# Patient Record
Sex: Female | Born: 1975 | Race: Black or African American | Hispanic: No | Marital: Married | Smoking: Never smoker
Health system: Southern US, Community
[De-identification: ages and names within clinical notes are randomized; demographics above are authoritative.]

## PROBLEM LIST (undated history)

## (undated) DIAGNOSIS — E559 Vitamin D deficiency, unspecified: Secondary | ICD-10-CM

## (undated) DIAGNOSIS — N938 Other specified abnormal uterine and vaginal bleeding: Secondary | ICD-10-CM

## (undated) DIAGNOSIS — I10 Essential (primary) hypertension: Secondary | ICD-10-CM

## (undated) DIAGNOSIS — N840 Polyp of corpus uteri: Secondary | ICD-10-CM

## (undated) DIAGNOSIS — IMO0001 Reserved for inherently not codable concepts without codable children: Secondary | ICD-10-CM

## (undated) DIAGNOSIS — M1990 Unspecified osteoarthritis, unspecified site: Secondary | ICD-10-CM

## (undated) DIAGNOSIS — R519 Headache, unspecified: Secondary | ICD-10-CM

## (undated) HISTORY — DX: Polyp of corpus uteri: N84.0

## (undated) HISTORY — DX: Reserved for inherently not codable concepts without codable children: IMO0001

## (undated) HISTORY — DX: Other specified abnormal uterine and vaginal bleeding: N93.8

---

## 2010-10-06 ENCOUNTER — Other Ambulatory Visit: Payer: Self-pay | Admitting: Gynecology

## 2010-10-06 ENCOUNTER — Ambulatory Visit (INDEPENDENT_AMBULATORY_CARE_PROVIDER_SITE_OTHER): Payer: Private Health Insurance - Indemnity | Admitting: Gynecology

## 2010-10-06 DIAGNOSIS — N921 Excessive and frequent menstruation with irregular cycle: Secondary | ICD-10-CM

## 2010-10-06 DIAGNOSIS — R635 Abnormal weight gain: Secondary | ICD-10-CM

## 2010-10-06 DIAGNOSIS — N949 Unspecified condition associated with female genital organs and menstrual cycle: Secondary | ICD-10-CM

## 2010-10-11 ENCOUNTER — Ambulatory Visit (INDEPENDENT_AMBULATORY_CARE_PROVIDER_SITE_OTHER): Payer: Private Health Insurance - Indemnity | Admitting: Gynecology

## 2010-10-11 ENCOUNTER — Other Ambulatory Visit: Payer: Private Health Insurance - Indemnity

## 2010-10-11 DIAGNOSIS — N840 Polyp of corpus uteri: Secondary | ICD-10-CM

## 2010-10-11 DIAGNOSIS — N949 Unspecified condition associated with female genital organs and menstrual cycle: Secondary | ICD-10-CM

## 2010-10-11 DIAGNOSIS — N926 Irregular menstruation, unspecified: Secondary | ICD-10-CM

## 2010-10-11 DIAGNOSIS — N938 Other specified abnormal uterine and vaginal bleeding: Secondary | ICD-10-CM

## 2010-10-11 DIAGNOSIS — N920 Excessive and frequent menstruation with regular cycle: Secondary | ICD-10-CM

## 2010-11-04 ENCOUNTER — Emergency Department (HOSPITAL_COMMUNITY)
Admission: EM | Admit: 2010-11-04 | Discharge: 2010-11-04 | Disposition: A | Discharge status: Home / Self Care | Payer: No Typology Code available for payment source | Attending: Emergency Medicine | Admitting: Emergency Medicine

## 2010-11-04 DIAGNOSIS — M542 Cervicalgia: Secondary | ICD-10-CM | POA: Insufficient documentation

## 2010-11-04 DIAGNOSIS — G43909 Migraine, unspecified, not intractable, without status migrainosus: Secondary | ICD-10-CM | POA: Insufficient documentation

## 2010-11-04 DIAGNOSIS — E669 Obesity, unspecified: Secondary | ICD-10-CM | POA: Insufficient documentation

## 2010-11-07 ENCOUNTER — Other Ambulatory Visit (INDEPENDENT_AMBULATORY_CARE_PROVIDER_SITE_OTHER): Payer: Private Health Insurance - Indemnity

## 2010-11-07 DIAGNOSIS — Z01818 Encounter for other preprocedural examination: Secondary | ICD-10-CM

## 2010-11-07 DIAGNOSIS — R823 Hemoglobinuria: Secondary | ICD-10-CM

## 2010-11-10 ENCOUNTER — Other Ambulatory Visit: Payer: Private Health Insurance - Indemnity

## 2010-11-10 ENCOUNTER — Ambulatory Visit (INDEPENDENT_AMBULATORY_CARE_PROVIDER_SITE_OTHER): Payer: Private Health Insurance - Indemnity | Admitting: Gynecology

## 2010-11-10 DIAGNOSIS — N949 Unspecified condition associated with female genital organs and menstrual cycle: Secondary | ICD-10-CM

## 2010-11-10 DIAGNOSIS — N840 Polyp of corpus uteri: Secondary | ICD-10-CM

## 2010-11-10 DIAGNOSIS — Z01818 Encounter for other preprocedural examination: Secondary | ICD-10-CM

## 2010-11-11 ENCOUNTER — Ambulatory Visit (HOSPITAL_BASED_OUTPATIENT_CLINIC_OR_DEPARTMENT_OTHER)
Admission: RE | Admit: 2010-11-11 | Discharge: 2010-11-11 | Disposition: A | Discharge status: Home / Self Care | Payer: Private Health Insurance - Indemnity | Source: Ambulatory Visit | Attending: Gynecology | Admitting: Gynecology

## 2010-11-11 ENCOUNTER — Other Ambulatory Visit: Payer: Self-pay | Admitting: Gynecology

## 2010-11-11 DIAGNOSIS — N938 Other specified abnormal uterine and vaginal bleeding: Secondary | ICD-10-CM

## 2010-11-11 DIAGNOSIS — N840 Polyp of corpus uteri: Secondary | ICD-10-CM

## 2010-11-11 DIAGNOSIS — N949 Unspecified condition associated with female genital organs and menstrual cycle: Secondary | ICD-10-CM | POA: Insufficient documentation

## 2010-11-11 DIAGNOSIS — N925 Other specified irregular menstruation: Secondary | ICD-10-CM

## 2010-11-11 HISTORY — DX: Polyp of corpus uteri: N84.0

## 2010-11-11 HISTORY — PX: HYSTEROSCOPY: SHX211

## 2010-11-11 HISTORY — DX: Other specified abnormal uterine and vaginal bleeding: N93.8

## 2010-11-14 NOTE — Op Note (Signed)
  [name], Kristen Shepherd NO.:  [id number]  MEDICAL RECORD NO.:  [id number]  LOCATION:  [facility]                         FACILITY:  [facility]  PHYSICIAN:  Sloan Galentine H. Lily Peer, M.D.DATE OF BIRTH:  1975/07/09  DATE OF PROCEDURE:  11/11/2010 DATE OF DISCHARGE:  11/04/2010                              OPERATIVE REPORT   SURGEON:  Lars Mage H. Lily Peer, M.D.  INDICATIONS FOR OPERATION:  A 35 year old gravida 0 with dysfunctional bleeding and endometrial polyps.  PREOPERATIVE DIAGNOSES: 1. Dysfunctional bleeding. 2. Endometrial polyps.  POSTOPERATIVE DIAGNOSIS: 1. Dysfunctional bleeding. 2. Endometrial polyps.  ANESTHESIA:  General endotracheal anesthesia.  PROCEDURE PERFORMED:  Resectoscope polypectomy and MyoSure morcellator.  FINDINGS:  The patient had 2 endometrial polyps that measured approximately 4 x 6 and 12 x 8 mm respectively on the posterior uterine wall.  Tubal ostia was identified.  Cervical canal was cleared.  No other abnormalities was noted and lush endometrium.  DESCRIPTION OF OPERATION:  After the patient was adequately counseled, she was taken to the operating room where she underwent successful general endotracheal anesthesia.  She received 1 g of cefotetan IV for prophylaxis.  After general endotracheal anesthesia was obtained, the vagina and perineum were prepped and draped in usual sterile fashion and the patient was placed in the high lithotomy position.  The patient had voided before being brought to the operating room.  A weighted speculum was placed in the posterior vaginal vault.  A single-tooth tenaculum was placed in the anterior cervical lip.  Prior to this, a bimanual exam had demonstrated uterus was anteverted, upper limits of normal with no palpable adnexal masses.  The cervix was dilated in an effort with Shawnie Pons dilators to allow introduction of the operative hysteroscope with normal saline as the distending media.  After thorough  inspection of the endometrial cavity once again confirming the 2 endometrial polyps at the posterior lower uterine segment surface of the uterus, the MyoSure morcellator was introduced and the polyps were resected and tissue was submitted for histological evaluation.  A thorough inspection of the cavity did not demonstrate any other lesions.  Pre and post pictures were obtained and a set of pictures will be kept in the patient's record at [facility], a second set will be kept in the patient's record at [facility] which will be shared with the patient's mother for postop visit.  The single-tooth tenaculum was removed.  The patient was extubated, transferred to recovery room with stable vital signs.  Fluid deficit from normal saline was balanced, 0 fluid deficit, and she received 30 mg of Toradol en route to the recovery room.  IV fluids consisted of 750 cc of lactated Ringer's.     Jet Traynham H. Lily Peer, M.D.     JHF/MEDQ  D:  11/11/2010  T:  11/11/2010  Job:  [id number]  Electronically Signed by Reynaldo Minium M.D. on 11/14/2010 08:45:51 AM

## 2010-11-14 NOTE — H&P (Signed)
NAME:  [name]              ACCOUNT NO.:  [id number]  MEDICAL RECORD NO.:  LOCATION:                                 FACILITY:  PHYSICIAN:  Shere Eisenhart H. Lily Peer, M.D.     DATE OF BIRTH:  DATE OF ADMISSION: DATE OF DISCHARGE:                             HISTORY & PHYSICAL   The patient is scheduled for surgery, Friday, June 22nd, at 7:30 a.m., at [facility].  Please have history and physical available.  CHIEF COMPLAINT:  Dysfunctional uterine bleeding and endometrial polyps.  HISTORY:  The patient is a 35 year old gravida 0 who was seen in the office as a new patient on May 17th.  She had been evaluated for dysfunctional uterine bleeding whereby she was bleeding in between cycles with heavy periods.  She had been using condoms for contraception.  An endometrial biopsy done on May 17th demonstrated evidence of benign endometrial polyp and scant benign endocervix, but no evidence hyperplasia or carcinoma identified.  Her TSH and prolactin have been normal and reliable.  A Pap smear was recently done within a year were all normal as well as her platelet count.  She was placed on Megace 40 mg b.i.d. to stop her bleeding.  The ultrasound demonstrated uterus that measured 7.2 x 5.6 x 4.7 cm with an endometrial stripe of 11.3 mm.  Right and left ovary with several follicles, minimal fluid. Sonohysterogram demonstrated 2 posterior uterine defects, consistent with 2 endometrial polyps, one measuring 4 x 6 mm, the other one 12 x 8 mm.  Now, the patient is scheduled to undergo resectoscopic polypectomy. She continues to bleed despite being on the Megace.  PAST MEDICAL HISTORY:  The patient denies any allergies.  Obstetric and gynecological conditions are unremarkable.  MEDICATIONS:  Women's One-A-Day.  SURGERIES:  None.  FAMILY HISTORY:  Mother with history of hypertension.  SOCIAL HISTORY:  The patient denies smoking, alcohol consumption, or utilization of  illicit drugs and she works in the [employer].  REVIEW OF SYSTEMS:  Significant only for the gynecological portion contributing to her dysfunctional uterine bleeding as a result of the endometrial polyps.  PHYSICAL EXAMINATION:  VITAL SIGNS:  The patient weighs 285 pounds.  She is 5 feet, 7 and one-quarter inches tall, BMI 45, blood pressure 134/90, today's blood pressure 120/78. HEENT:  Unremarkable. NECK:  Supple.  Trachea midline.  No carotid bruits.  No thyromegaly. LUNGS:  Clear to auscultation without rhonchis or wheezes. HEART:  Regular rate and rhythm.  No murmurs or rubs. BREASTS:  Not done. ABDOMEN:  Soft, nontender.  No rebound.  No guarding. PELVIC:  Bartholin, urethra, Skene's within normal limits.  Vagina and cervix, no gross lesions on inspection with exception of blood in the vaginal vault.  Uterus upper limits of normal.  Adnexa, no mass or tenderness.  Rectal exam deferred.  ASSESSMENT:  A 35 year old gravida 0 with no plans of pregnancy in the future either her or her partner of 9 years.  Interested in proceeding with a resectoscopic polypectomy in an effort to curtail her dysfunctional uterine bleeding.  The risks, benefits, and pros and cons of the resectoscopic polypectomy were discussed with the patient  to include infection, although she will receive prophylaxis antibiotics. The risks for DVT and subsequent pulmonary embolism were discussed and for this reason she will have PSA stockings and also the risk of uterine perforation, requiring open emergency hysterectomy which would cause to remove her uterus and not be able to have any children in the future were discussed and also in the event of hemorrhages she would need blood or blood products.  She is fully aware of the potential risk of blood and blood products being one in 100,000 consist of anaphylactic reaction, hepatitis, and AIDS were discussed.  All these issues were discussed  with the patient in detail.  All questions were answered and we will follow accordingly.  PLAN:  The patient is scheduled for resectoscopic polypectomy on Friday, June 22nd, at 7:30 a.m., at [facility].     Maximum Reiland H. Lily Peer, M.D.     JHF/MEDQ  D:  11/10/2010  T:  11/10/2010  Job:  [id number]  Electronically Signed by Reynaldo Minium M.D. on 11/14/2010 08:45:53 AM

## 2010-11-14 NOTE — H&P (Signed)
NAME:  Fredirick Shepherd              ACCOUNT NO.:  [id number]  MEDICAL RECORD NO.:  LOCATION:                                 FACILITY:  PHYSICIAN:  Juan H. Lily Peer, M.D.     DATE OF BIRTH:  DATE OF ADMISSION: DATE OF DISCHARGE:                             HISTORY & PHYSICAL   The patient is scheduled for surgery on Friday, November 11, 2010, at 7:30 a.m. at [facility], please have history and physical available.  CHIEF COMPLAINT:  Dysfunctional bleeding history.  HISTORY:  The patient is a 35 year old gravida 0 who was seen in the office for preoperative consultation on Oct 11, 2010.  She has a history of dysfunctional bleeding whereby she will be bleeding between cycles and very heavy.  She has been using condoms for contraception.  An endometrial biopsy done on Oct 06, 2010, demonstrated benign endometrial polyp, scant benign endocervix, no hyperplasia or malignancy noted.  TSH and prolactin were reported to be normal.  Pap smear is up-to-date this year, which was normal and she had been on Megace 40 mg b.i.d. to help stop her bleeding.  She had a sonohysterogram on Oct 11, 2010, which demonstrated 2 posterior uterine defects, one measuring 4 x 6 mm, the other one 12 x 8 mm consistent with endometrial polyps and the patient for this reason is scheduled to undergo resectoscopic polypectomy.  PAST MEDICAL HISTORY:  She denies any allergies.  MEDICATIONS:  Consist of Megace 40 mg b.i.d. as well as multivitamins.  The patient denies any other medical condition.  She is overweight at 285 pounds with BMI of 45.  No prior surgery.  FAMILY HISTORY:  Mother with history of hypertension.  REVIEW OF SYSTEMS:  Significant for the gynecological portion contributing to dysfunctional uterine bleeding as a result of endometrial polyps.  SOCIAL HISTORY:  The patient is nulliparous works in [employer] development [employer].  PHYSICAL EXAMINATION:  VITAL SIGNS:  The patient  weighs 285 pounds.  She is 5 feet 7-1/4 inches tall, BMI of 45, blood pressure 134/90. HEENT: Unremarkable. NECK:  Supple.  Trachea midline.  No carotid bruits, no thyromegaly. LUNGS:  Clear to auscultation without rhonchi or wheezes. HEART:  Regular rate and rhythm.  No murmurs or gallop. BREASTS:  Exam not done. ABDOMEN:  Soft, nontender.  No rebound, no guarding. PELVIC:  Bartholin, urethra, Skene's glands within normal limits. Vagina and cervix, no gross lesions on inspection.  Uterus, upper limits of normal.  Adnexa no palpable masses or tenderness. RECTAL EXAM:  Unremarkable.  ASSESSMENT:  A 35 year old gravida 0 with dysfunctional bleeding as a result of endometrial polyps.  The patient with benign endometrial biopsy.  Ultrasound demonstrated 2 posterior uterine polyps.  The patient scheduled for resectoscopic polypectomy.  She has had a normal Pap smear, normal TSH, normal prolactin, normal CBC.  The risks, benefits and pros and cons of the operation for which we are planning a resectoscopic polypectomy or infection.  She will receive prophylaxis antibiotic and the risk of DVT and subsequent pulmonary embolism, for this reason the patient will have PSA stockings.  Also the risk of perforation.  Also the risk for fluid extravasation contributing to  pulmonary edema and hyponatremia were discussed with the patient.  Also the risk of uterine perforation requiring open laparotomy and emergency treatment to curtail her bleeding and if blood or blood products are needed, she is fully aware of the potential risk of anaphylactic reaction, hepatitis and AIDS.  All these issues were discussed with the patient and all questions were answered and will follow accordingly.  PLAN:  The patient is scheduled for resectoscope polypectomy on Friday, November 11, 2010, at 7:30 a.m. at [facility], please have history and physical available.     Juan H. Lily Peer,  M.D.     JHF/MEDQ  D:  11/08/2010  T:  11/08/2010  Job:  [id number]  Electronically Signed by Reynaldo Minium M.D. on 11/14/2010 08:45:55 AM

## 2010-11-24 ENCOUNTER — Ambulatory Visit (INDEPENDENT_AMBULATORY_CARE_PROVIDER_SITE_OTHER): Payer: Private Health Insurance - Indemnity | Admitting: Gynecology

## 2010-11-24 DIAGNOSIS — Z9889 Other specified postprocedural states: Secondary | ICD-10-CM

## 2010-11-24 DIAGNOSIS — IMO0001 Reserved for inherently not codable concepts without codable children: Secondary | ICD-10-CM

## 2010-11-24 DIAGNOSIS — Z30431 Encounter for routine checking of intrauterine contraceptive device: Secondary | ICD-10-CM

## 2010-11-24 HISTORY — DX: Reserved for inherently not codable concepts without codable children: IMO0001

## 2010-11-24 HISTORY — PX: INTRAUTERINE DEVICE INSERTION: SHX323

## 2010-11-25 ENCOUNTER — Ambulatory Visit: Payer: Private Health Insurance - Indemnity | Admitting: Gynecology

## 2010-12-22 ENCOUNTER — Ambulatory Visit (INDEPENDENT_AMBULATORY_CARE_PROVIDER_SITE_OTHER): Payer: Private Health Insurance - Indemnity | Admitting: Gynecology

## 2010-12-22 ENCOUNTER — Encounter: Payer: Self-pay | Admitting: Gynecology

## 2010-12-22 VITALS — BP 132/90

## 2010-12-22 DIAGNOSIS — N949 Unspecified condition associated with female genital organs and menstrual cycle: Secondary | ICD-10-CM

## 2010-12-22 DIAGNOSIS — N938 Other specified abnormal uterine and vaginal bleeding: Secondary | ICD-10-CM

## 2010-12-22 NOTE — Progress Notes (Deleted)
Patient information: Hysterectomy (The Basics)   What is a hysterectomy? - A hysterectomy is surgery to remove the uterus . The uterus is the part of a woman's body that carries a baby if she is pregnant. Another word for uterus is "womb." If you have a hysterectomy, you will never be able to carry a pregnancy. Are there different kinds of surgery for hysterectomy? - Yes, there are 3 main kinds of surgery:  Abdominal hysterectomy - To do an abdominal hysterectomy, the doctor makes a cut in the belly and removes the uterus through that opening.  Laparoscopic hysterectomy - To do a laparoscopic hysterectomy, the doctor inserts a tiny camera and tools through small openings in the belly. Then he or she removes the uterus bit by bit through one of the holes.  Vaginal hysterectomy - To do a vaginal hysterectomy, the doctor makes cuts inside the vagina and removes the uterus through the vagina. Vaginal hysterectomy leaves no visible scars, but it is not always possible. Sometimes doctors do vaginal hysterectomy but also use the tools used in laparoscopic hysterectomy. This is called a "laparoscopic-assisted vaginal hysterectomy." If this approach is used, the uterus is removed through the vagina.  Why might a woman need a hysterectomy? - A hysterectomy might be done to treat:  Abnormal bleeding - Some women bleed too much during their period or at times when they should not be bleeding. This can lead to a condition called anemia, which can make you feel very tired.  Fibroids - Fibroids are tough balls of muscle that form in the uterus. They can get very big and press on the organs inside the belly. They can also cause abnormal bleeding.  Pelvic organ prolapse - Pelvic organ prolapse is when the uterus falls down into the vagina.  Cancer or conditions that could lead to cancer - Cancer can affect the uterus or the cervix, the organ that separates the uterus and the vagina.  Sometimes doctors suggest removing these organs if they show signs that cancer is about to form.  Ongoing pain - Some women feel pain that will not go away in the area just below the belly. This is called "chronic pelvic pain." Hysterectomy can sometimes help cure this pain.  What if I do not want a hysterectomy? - Many of the conditions that are treated with hysterectomy can be treated in other ways instead. If you do not want the surgery, ask your doctor or nurse if you have other treatment options. Ask, too, what will happen if you do NOT have a hysterectomy. What if I want to get pregnant? - If you have a hysterectomy, you will not be able to get pregnant. Still, if you have cancer or another serious problem, you may need to have a hysterectomy. If you want to have children, speak to your doctor or nurse about your options.  Is the uterus the only organ that is removed during a hysterectomy? - That depends on what you want and on why you are having a hysterectomy. During a hysterectomy, doctors sometimes also remove the: Ovaries and the tubes that connect the ovaries to the uterus - The ovaries are the organs that make "female hormones," including estrogen and progesterone. These hormones help keep bones healthy and are important for other aspects of health. Women who have their ovaries removed sometimes need to take hormone pills.  Cervix - For a vaginal hysterectomy, the cervix must be removed. For an abdominal or laparoscopic hysterectomy, the cervix can   be removed or left in place.  Should I have my ovaries removed if I have a hysterectomy? - Deciding whether or not to have your ovaries removed can be tough. You will need to think about how old you are, and about how not having ovaries might affect you.  In women who have not been through menopause, having the ovaries removed can lead to hot flashes, bone loss, and other problems. In women of any age, having the ovaries removed can sometimes also  reduce interest in sex. On the other hand, women who have health problems that get worse at certain times in the menstrual cycle sometimes feel better without their ovaries. Plus, in rare cases, the ovaries can develop cancer, so women sometimes choose to have them removed. Before you have surgery, ask your doctor about the pros and cons of having your ovaries removed.  What will my life be like? - Studies show that women can have happy, full lives after a hysterectomy. Many women feel better after the surgery, because they no longer have the symptoms that bothered them before.    

## 2010-12-22 NOTE — Progress Notes (Signed)
Patient is a 35 year old gravida 0 who presented to the office today for her ongoing history of dysfunctional uterine bleeding patient has been on the oral contraceptive pills on several regimens in the past by a different providers before begin to see her in consultation she is endometrial biopsy done here in the office 10/06/2010 with endometrium consisting of endometrial polyp and scant benign endocervix no hyperplasia or malignancy noted she had a sonohysterogram in the office which demonstrated uterus to measure 7.2 x 5.6 x 4.7 cm endometrial stripe 11.3 mm ovaries consistent with appears to be polycystic ovarian disease. She underwent a myosure resectoscopic polypectomy on June 22 of this year but pathology report demonstrated benign fragments of endometrial polyps and benign endometrium. We had also placed a Mirena IUD because she continued to bleed on 11/24/2010 and she returns today today as a result of her continuous bleeding. She had also been given Lysteda to help her stop her bleeding with no her resolution as well she had been placed on Megace 40 mg twice a day not help with her bleeding as well. In the event of a mild endometritis she had also been placed on the upper margin 100 mg for twice a day at time of her last visit.  Pelvic exam: Bartholin urethra Skene was within normal limits Vagina: Some blood was present in the vaginal vault Cervix: IUD string not see no active bleeding Uterus anteverted to upper limits of normal no problems Masses or tenderness  Assessment: Patient with persistent dysfunctional uterine bleeding despite different treatment modalities have been attempted. She's had a normal platelet count and normal CBC. Would want to go ahead and check her PT, PTT, and fibrinogen today. She is interested in proceeding with definitive treatment for this ongoing problem she stated that for this problem became worse that she had been entertained the idea of not having any children  she's been with the same partner for 6 years and has never been interested in having children and she states that she is "1000% sure that she does not want having children". I've given her literature and information on hysterectomy she will return back next week for a ultrasound to confirm that the IUD still in place and counseled her for preoperatively for her surgery. We did discuss today the risks benefits and pros and cons of the operation in detail.

## 2010-12-22 NOTE — Patient Instructions (Signed)
Patient information: Hysterectomy (The Basics)   What is a hysterectomy? - A hysterectomy is surgery to remove the uterus . The uterus is the part of a woman's body that carries a baby if she is pregnant. Another word for uterus is "womb." If you have a hysterectomy, you will never be able to carry a pregnancy. Are there different kinds of surgery for hysterectomy? - Yes, there are 3 main kinds of surgery:  Abdominal hysterectomy - To do an abdominal hysterectomy, the doctor makes a cut in the belly and removes the uterus through that opening.  Laparoscopic hysterectomy - To do a laparoscopic hysterectomy, the doctor inserts a tiny camera and tools through small openings in the belly. Then he or she removes the uterus bit by bit through one of the holes.  Vaginal hysterectomy - To do a vaginal hysterectomy, the doctor makes cuts inside the vagina and removes the uterus through the vagina. Vaginal hysterectomy leaves no visible scars, but it is not always possible. Sometimes doctors do vaginal hysterectomy but also use the tools used in laparoscopic hysterectomy. This is called a "laparoscopic-assisted vaginal hysterectomy." If this approach is used, the uterus is removed through the vagina.  Why might a woman need a hysterectomy? - A hysterectomy might be done to treat:  Abnormal bleeding - Some women bleed too much during their period or at times when they should not be bleeding. This can lead to a condition called anemia, which can make you feel very tired.  Fibroids - Fibroids are tough balls of muscle that form in the uterus. They can get very big and press on the organs inside the belly. They can also cause abnormal bleeding.  Pelvic organ prolapse - Pelvic organ prolapse is when the uterus falls down into the vagina.  Cancer or conditions that could lead to cancer - Cancer can affect the uterus or the cervix, the organ that separates the uterus and the vagina.  Sometimes doctors suggest removing these organs if they show signs that cancer is about to form.  Ongoing pain - Some women feel pain that will not go away in the area just below the belly. This is called "chronic pelvic pain." Hysterectomy can sometimes help cure this pain.  What if I do not want a hysterectomy? - Many of the conditions that are treated with hysterectomy can be treated in other ways instead. If you do not want the surgery, ask your doctor or nurse if you have other treatment options. Ask, too, what will happen if you do NOT have a hysterectomy. What if I want to get pregnant? - If you have a hysterectomy, you will not be able to get pregnant. Still, if you have cancer or another serious problem, you may need to have a hysterectomy. If you want to have children, speak to your doctor or nurse about your options.  Is the uterus the only organ that is removed during a hysterectomy? - That depends on what you want and on why you are having a hysterectomy. During a hysterectomy, doctors sometimes also remove the: Ovaries and the tubes that connect the ovaries to the uterus - The ovaries are the organs that make "female hormones," including estrogen and progesterone. These hormones help keep bones healthy and are important for other aspects of health. Women who have their ovaries removed sometimes need to take hormone pills.  Cervix - For a vaginal hysterectomy, the cervix must be removed. For an abdominal or laparoscopic hysterectomy, the cervix can  be removed or left in place.  Should I have my ovaries removed if I have a hysterectomy? - Deciding whether or not to have your ovaries removed can be tough. You will need to think about how old you are, and about how not having ovaries might affect you.  In women who have not been through menopause, having the ovaries removed can lead to hot flashes, bone loss, and other problems. In women of any age, having the ovaries removed can sometimes also  reduce interest in sex. On the other hand, women who have health problems that get worse at certain times in the menstrual cycle sometimes feel better without their ovaries. Plus, in rare cases, the ovaries can develop cancer, so women sometimes choose to have them removed. Before you have surgery, ask your doctor about the pros and cons of having your ovaries removed.  What will my life be like? - Studies show that women can have happy, full lives after a hysterectomy. Many women feel better after the surgery, because they no longer have the symptoms that bothered them before.

## 2010-12-26 ENCOUNTER — Ambulatory Visit: Payer: Private Health Insurance - Indemnity | Admitting: Gynecology

## 2010-12-26 ENCOUNTER — Telehealth: Payer: Self-pay

## 2010-12-26 NOTE — Telephone Encounter (Signed)
I called patient and discussed when she would like to scheduled her [facility].Marland Kitchen  Per her request I scheduled her for Th Aug 23 at 7:30am at [facility].  Pt has u/s and visit on Aug 14 and hoped she could do her preop consult at that visit.  I changed the doctor visit to 30 minutes in hopes that can be accomplished.  I advised her regarding preop labs at [facility] lab on the Fri, Mon or Tues before surgery.   I mailed pamphlet from [facility] and surgery financial letter to her.  All questions answered and my direct phone number provided for any questions she might have.

## 2010-12-27 ENCOUNTER — Other Ambulatory Visit: Payer: Self-pay | Admitting: Gynecology

## 2010-12-28 LAB — APTT: aPTT: 55 seconds — ABNORMAL HIGH (ref 24–37)

## 2010-12-28 LAB — PROTIME-INR: Prothrombin Time: 17.9 seconds — ABNORMAL HIGH (ref 11.6–15.2)

## 2010-12-28 LAB — FIBRINOGEN: Fibrinogen: 120 mg/dL — ABNORMAL LOW (ref 204–475)

## 2010-12-28 NOTE — Progress Notes (Signed)
Addended by: Ok Edwards on: 12/28/2010 05:14 PM   Modules accepted: Orders

## 2010-12-29 NOTE — Progress Notes (Signed)
Addended by: Ok Edwards on: 12/29/2010 05:04 PM   Modules accepted: Orders

## 2010-12-30 NOTE — Progress Notes (Signed)
Addended by: Richardson Chiquito on: 12/30/2010 03:52 PM   Modules accepted: Orders

## 2010-12-30 NOTE — Progress Notes (Signed)
Addended by: Keenan Bachelor on: 12/30/2010 11:13 AM   Modules accepted: Orders

## 2011-01-03 ENCOUNTER — Ambulatory Visit: Payer: Private Health Insurance - Indemnity | Admitting: Gynecology

## 2011-01-03 ENCOUNTER — Other Ambulatory Visit: Payer: Self-pay | Admitting: Oncology

## 2011-01-03 ENCOUNTER — Encounter: Payer: Self-pay | Admitting: Gynecology

## 2011-01-03 ENCOUNTER — Other Ambulatory Visit: Payer: Private Health Insurance - Indemnity

## 2011-01-03 ENCOUNTER — Encounter (HOSPITAL_BASED_OUTPATIENT_CLINIC_OR_DEPARTMENT_OTHER): Payer: Private Health Insurance - Indemnity | Admitting: Oncology

## 2011-01-03 ENCOUNTER — Encounter: Payer: Private Health Insurance - Indemnity | Admitting: Oncology

## 2011-01-03 ENCOUNTER — Ambulatory Visit (INDEPENDENT_AMBULATORY_CARE_PROVIDER_SITE_OTHER): Payer: Private Health Insurance - Indemnity | Admitting: Gynecology

## 2011-01-03 DIAGNOSIS — D68318 Other hemorrhagic disorder due to intrinsic circulating anticoagulants, antibodies, or inhibitors: Secondary | ICD-10-CM

## 2011-01-03 DIAGNOSIS — R102 Pelvic and perineal pain: Secondary | ICD-10-CM

## 2011-01-03 DIAGNOSIS — N949 Unspecified condition associated with female genital organs and menstrual cycle: Secondary | ICD-10-CM

## 2011-01-03 DIAGNOSIS — D699 Hemorrhagic condition, unspecified: Secondary | ICD-10-CM

## 2011-01-03 DIAGNOSIS — N938 Other specified abnormal uterine and vaginal bleeding: Secondary | ICD-10-CM | POA: Insufficient documentation

## 2011-01-03 LAB — COMPREHENSIVE METABOLIC PANEL
Albumin: 4 g/dL (ref 3.5–5.2)
Alkaline Phosphatase: 66 U/L (ref 39–117)
CO2: 28 mEq/L (ref 19–32)
Calcium: 9.6 mg/dL (ref 8.4–10.5)
Chloride: 102 mEq/L (ref 96–112)
Glucose, Bld: 76 mg/dL (ref 70–99)
Potassium: 3.8 mEq/L (ref 3.5–5.3)
Sodium: 137 mEq/L (ref 135–145)
Total Protein: 7.5 g/dL (ref 6.0–8.3)

## 2011-01-03 LAB — CBC WITH DIFFERENTIAL/PLATELET
BASO%: 0.3 % (ref 0.0–2.0)
EOS%: 1.3 % (ref 0.0–7.0)
HCT: 40.2 % (ref 34.8–46.6)
LYMPH%: 39.7 % (ref 14.0–49.7)
MCH: 27.4 pg (ref 25.1–34.0)
MCHC: 32.3 g/dL (ref 31.5–36.0)
NEUT%: 51 % (ref 38.4–76.8)
Platelets: 296 10*3/uL (ref 145–400)
lymph#: 1.5 10*3/uL (ref 0.9–3.3)

## 2011-01-03 LAB — PROTIME-INR

## 2011-01-03 NOTE — Progress Notes (Signed)
Patient has been referred secondary to persistent dysfunctional uterine bleeding despite hormonal manipulation, resectoscopic polypectomy, Mirena IUD placement. Recent labs have demonstrated a PT elevated at 17.9 INR was 1.4 to PTT was elevated at 55 seconds fibrinogen was low at 120. Her CBC recently had demonstrated a hemoglobin 13.3 hematocrit 40.2 and platelet count 324,000. Recent ultrasound normal uterus with normal appearing ovaries with exception of evidence of polycystic ovarian disease. Patient been referred to rule out von Willebrand's disease. Return today demonstrated a normal size uterus the dimensions of 8.3 x 5.8 x 4.6 cm with endometrial stripe of 4.6 mm. The IUD was seen in the intrauterine cavity. Patient with normal ovaries. She is continued on her Megace 40 mg twice a day and continues to bleed. She has an appointment with the medical oncologist this afternoon I have for him a copy my office note in her recent abnormal clotting studies further evaluation. It appears patient may have von Willebrand's disease. She was scheduled for a vaginal hysterectomy on August 23 before these lab results became evident and we'll postponed until further notice. Will wait for the recommendation from the medical oncologist.

## 2011-01-04 ENCOUNTER — Other Ambulatory Visit: Payer: Self-pay | Admitting: Gynecology

## 2011-01-04 ENCOUNTER — Encounter: Payer: Self-pay | Admitting: Gynecology

## 2011-01-04 DIAGNOSIS — N938 Other specified abnormal uterine and vaginal bleeding: Secondary | ICD-10-CM

## 2011-01-04 NOTE — Progress Notes (Signed)
Vag Hyst cancelled until further notice per Dr. Glenetta Hew. Pt being assessed for Von Willebrand's. ka

## 2011-01-04 NOTE — Progress Notes (Signed)
Addended by: Keenan Bachelor on: 01/04/2011 09:58 AM   Modules accepted: Orders

## 2011-01-10 ENCOUNTER — Other Ambulatory Visit: Payer: Private Health Insurance - Indemnity

## 2011-01-12 ENCOUNTER — Ambulatory Visit (HOSPITAL_BASED_OUTPATIENT_CLINIC_OR_DEPARTMENT_OTHER)
Admission: RE | Admit: 2011-01-12 | Payer: Private Health Insurance - Indemnity | Source: Ambulatory Visit | Admitting: Gynecology

## 2011-01-13 LAB — VON WILLEBRAND FACTOR MULTIMER
Factor-VIII Activity: 79 % (ref 50–180)
Ristocetin Co-Factor: 59 % (ref 42–200)

## 2011-01-24 ENCOUNTER — Telehealth: Payer: Self-pay | Admitting: *Deleted

## 2011-01-24 NOTE — Telephone Encounter (Signed)
Pt called cancer center and left message with the nurse to get lab results sent to dr jf to review. She will call back once she speaks with the nurse.

## 2011-02-08 ENCOUNTER — Encounter: Payer: Self-pay | Admitting: Gynecology

## 2011-04-05 LAB — APTT: aPTT: 40 seconds — ABNORMAL HIGH (ref 24–37)

## 2011-04-05 LAB — VON WILLEBRAND PANEL: Ristocetin Co-factor, Plasma: 59 % (ref 42–200)

## 2011-04-05 LAB — FIBRINOGEN: Fibrinogen: 316 mg/dL (ref 204–475)

## 2011-12-06 ENCOUNTER — Other Ambulatory Visit (HOSPITAL_COMMUNITY)
Admission: RE | Admit: 2011-12-06 | Discharge: 2011-12-06 | Disposition: A | Discharge status: Home / Self Care | Payer: Private Health Insurance - Indemnity | Source: Ambulatory Visit | Attending: Gynecology | Admitting: Gynecology

## 2011-12-06 ENCOUNTER — Ambulatory Visit (INDEPENDENT_AMBULATORY_CARE_PROVIDER_SITE_OTHER): Payer: Private Health Insurance - Indemnity | Admitting: Gynecology

## 2011-12-06 ENCOUNTER — Telehealth: Payer: Self-pay | Admitting: Gynecology

## 2011-12-06 ENCOUNTER — Ambulatory Visit (INDEPENDENT_AMBULATORY_CARE_PROVIDER_SITE_OTHER): Payer: Private Health Insurance - Indemnity

## 2011-12-06 ENCOUNTER — Encounter: Payer: Self-pay | Admitting: Gynecology

## 2011-12-06 VITALS — BP 132/90 | Ht 67.25 in | Wt 288.0 lb

## 2011-12-06 DIAGNOSIS — Z1151 Encounter for screening for human papillomavirus (HPV): Secondary | ICD-10-CM | POA: Insufficient documentation

## 2011-12-06 DIAGNOSIS — Z01419 Encounter for gynecological examination (general) (routine) without abnormal findings: Secondary | ICD-10-CM | POA: Insufficient documentation

## 2011-12-06 DIAGNOSIS — D689 Coagulation defect, unspecified: Secondary | ICD-10-CM

## 2011-12-06 DIAGNOSIS — N8300 Follicular cyst of ovary, unspecified side: Secondary | ICD-10-CM

## 2011-12-06 DIAGNOSIS — Z30431 Encounter for routine checking of intrauterine contraceptive device: Secondary | ICD-10-CM

## 2011-12-06 DIAGNOSIS — R635 Abnormal weight gain: Secondary | ICD-10-CM

## 2011-12-06 LAB — PROTIME-INR
INR: 1.04 (ref <1.50)
Prothrombin Time: 14 seconds (ref 11.6–15.2)

## 2011-12-06 LAB — CBC WITH DIFFERENTIAL/PLATELET
Basophils Absolute: 0 10*3/uL (ref 0.0–0.1)
Eosinophils Absolute: 0.1 10*3/uL (ref 0.0–0.7)
Eosinophils Relative: 2 % (ref 0–5)
HCT: 38.9 % (ref 36.0–46.0)
Lymphocytes Relative: 29 % (ref 12–46)
MCH: 26.8 pg (ref 26.0–34.0)
MCHC: 32.4 g/dL (ref 30.0–36.0)
MCV: 82.6 fL (ref 78.0–100.0)
Monocytes Absolute: 0.4 10*3/uL (ref 0.1–1.0)
Platelets: 313 10*3/uL (ref 150–400)
RDW: 14.6 % (ref 11.5–15.5)
WBC: 4.5 10*3/uL (ref 4.0–10.5)

## 2011-12-06 LAB — CHOLESTEROL, TOTAL: Cholesterol: 157 mg/dL (ref 0–200)

## 2011-12-06 LAB — TSH: TSH: 2.795 u[IU]/mL (ref 0.350–4.500)

## 2011-12-06 NOTE — Telephone Encounter (Signed)
Dr Glenetta Hew asked me to call patient and let her know regarding her pelvic ultrasound results from today.  He said "IUD is in place and u/s was normal".  I left message for patient to call me for u/s results.

## 2011-12-06 NOTE — Progress Notes (Signed)
Kristen Shepherd February 18, 1976 [id number]   History:    36 y.o.  for annual gyn exam with no complaints. Patient had a Mirena IUD placed in 2012 after she had had a resectoscopic polypectomy. Patient is having very light if any menstrual cycles. She is overweight and is borderline hypertensive. Patient denies any prior history of any abnormal Pap smears. Patient does her monthly self breast examination.  Past medical history,surgical history, family history and social history were all reviewed and documented in the EPIC chart.  Gynecologic History No LMP recorded. Patient is not currently having periods (Reason: IUD). Contraception: IUD Last Pap: 2012?Marland Kitchen Results were: normal Last mammogram: Not indicated. Results were: Not indicated  Obstetric History OB History    Grav Para Term Preterm Abortions TAB SAB Ect Mult Living   0                ROS: A ROS was performed and pertinent positives and negatives are included in the history.  GENERAL: No fevers or chills. HEENT: No change in vision, no earache, sore throat or sinus congestion. NECK: No pain or stiffness. CARDIOVASCULAR: No chest pain or pressure. No palpitations. PULMONARY: No shortness of breath, cough or wheeze. GASTROINTESTINAL: No abdominal pain, nausea, vomiting or diarrhea, melena or bright red blood per rectum. GENITOURINARY: No urinary frequency, urgency, hesitancy or dysuria. MUSCULOSKELETAL: No joint or muscle pain, no back pain, no recent trauma. DERMATOLOGIC: No rash, no itching, no lesions. ENDOCRINE: No polyuria, polydipsia, no heat or cold intolerance. No recent change in weight. HEMATOLOGICAL: No anemia or easy bruising or bleeding. NEUROLOGIC: No headache, seizures, numbness, tingling or weakness. PSYCHIATRIC: No depression, no loss of interest in normal activity or change in sleep pattern.     Exam: chaperone present  BP 132/90  Ht 5' 7.25" (1.708 m)  Wt 288 lb (130.636 kg)  BMI 44.77 kg/m2  Body mass index is 44.77  kg/(m^2).  General appearance : Well developed well nourished female. No acute distress HEENT: Neck supple, trachea midline, no carotid bruits, no thyroidmegaly Lungs: Clear to auscultation, no rhonchi or wheezes, or rib retractions  Heart: Regular rate and rhythm, no murmurs or gallops Breast:Examined in sitting and supine position were symmetrical in appearance, no palpable masses or tenderness,  no skin retraction, no nipple inversion, no nipple discharge, no skin discoloration, no axillary or supraclavicular lymphadenopathy Abdomen: no palpable masses or tenderness, no rebound or guarding Extremities: no edema or skin discoloration or tenderness  Pelvic:  Bartholin, Urethra, Skene Glands: Within normal limits             Vagina: No gross lesions or discharge  Cervix: No gross lesions or discharge, IUD string not seen  Uterus  anteverted, normal size, shape and consistency, non-tender and mobile  Adnexa  Without masses or tenderness  Anus and perineum  normal   Rectovaginal  normal sphincter tone without palpated masses or tenderness             Hemoccult not done   Ultrasound done today due to the fact of the IUD string was not seen. Results of ultrasound as follows:  Uterus measures 7 20 x 6.0 x 4.4 cm endometrial stripe 5.6 mm. IUD was seen in normal position. Right and left ovary were normal. Otherwise unremarkable ultrasound.  Assessment/Plan:  36 y.o. female for annual exam who is overweight and borderline hypertensive. We discussed importance of regular exercise and diet for which literature formation was provided. The following labs will be drawn today: Hemoglobin  A1c, cholesterol, CBC, urinalysis, TSH, along with Pap smear. New screening guidelines were discussed as well. Review of her record indicated that when she was being evaluated because of her menorrhagia prior to her resectoscopic polypectomy she had been referred to the hematologist oncologist as a result of her decreased  fibrinogen level and elevated PT and PTT. We'll repeat today a fibrinogen PT and PTT as well. Patient's currently having no bleeding problems. Will notify her if any of the above tests are abnormal and manage accordingly.   Ok Edwards MD, 10:43 AM 12/06/2011

## 2011-12-06 NOTE — Patient Instructions (Addendum)
Health Maintenance, Females A healthy lifestyle and preventative care can promote health and wellness.  Maintain regular health, dental, and eye exams.   Eat a healthy diet. Foods like vegetables, fruits, whole grains, low-fat dairy products, and lean protein foods contain the nutrients you need without too many calories. Decrease your intake of foods high in solid fats, added sugars, and salt. Get information about a proper diet from your caregiver, if necessary.   Regular physical exercise is one of the most important things you can do for your health. Most adults should get at least 150 minutes of moderate-intensity exercise (any activity that increases your heart rate and causes you to sweat) each week. In addition, most adults need muscle-strengthening exercises on 2 or more days a week.    Maintain a healthy weight. The body mass index (BMI) is a screening tool to identify possible weight problems. It provides an estimate of body fat based on height and weight. Your caregiver can help determine your BMI, and can help you achieve or maintain a healthy weight. For adults 20 years and older:   A BMI below 18.5 is considered underweight.   A BMI of 18.5 to 24.9 is normal.   A BMI of 25 to 29.9 is considered overweight.   A BMI of 30 and above is considered obese.   Maintain normal blood lipids and cholesterol by exercising and minimizing your intake of saturated fat. Eat a balanced diet with plenty of fruits and vegetables. Blood tests for lipids and cholesterol should begin at age 20 and be repeated every 5 years. If your lipid or cholesterol levels are high, you are over 50, or you are a high risk for heart disease, you may need your cholesterol levels checked more frequently.Ongoing high lipid and cholesterol levels should be treated with medicines if diet and exercise are not effective.   If you smoke, find out from your caregiver how to quit. If you do not use tobacco, do not start.    If you are pregnant, do not drink alcohol. If you are breastfeeding, be very cautious about drinking alcohol. If you are not pregnant and choose to drink alcohol, do not exceed 1 drink per day. One drink is considered to be 12 ounces (355 mL) of beer, 5 ounces (148 mL) of wine, or 1.5 ounces (44 mL) of liquor.   Avoid use of street drugs. Do not share needles with anyone. Ask for help if you need support or instructions about stopping the use of drugs.   High blood pressure causes heart disease and increases the risk of stroke. Blood pressure should be checked at least every 1 to 2 years. Ongoing high blood pressure should be treated with medicines, if weight loss and exercise are not effective.   If you are 55 to 37 years old, ask your caregiver if you should take aspirin to prevent strokes.   Diabetes screening involves taking a blood sample to check your fasting blood sugar level. This should be done once every 3 years, after age 45, if you are within normal weight and without risk factors for diabetes. Testing should be considered at a younger age or be carried out more frequently if you are overweight and have at least 1 risk factor for diabetes.   Breast cancer screening is essential preventative care for women. You should practice "breast self-awareness." This means understanding the normal appearance and feel of your breasts and may include breast self-examination. Any changes detected, no matter how   small, should be reported to a caregiver. Women in their 20s and 30s should have a clinical breast exam (CBE) by a caregiver as part of a regular health exam every 1 to 3 years. After age 40, women should have a CBE every year. Starting at age 40, women should consider having a mammogram (breast X-ray) every year. Women who have a family history of breast cancer should talk to their caregiver about genetic screening. Women at a high risk of breast cancer should talk to their caregiver about having  an MRI and a mammogram every year.   The Pap test is a screening test for cervical cancer. Women should have a Pap test starting at age 21. Between ages 21 and 29, Pap tests should be repeated every 2 years. Beginning at age 30, you should have a Pap test every 3 years as long as the past 3 Pap tests have been normal. If you had a hysterectomy for a problem that was not cancer or a condition that could lead to cancer, then you no longer need Pap tests. If you are between ages 65 and 70, and you have had normal Pap tests going back 10 years, you no longer need Pap tests. If you have had past treatment for cervical cancer or a condition that could lead to cancer, you need Pap tests and screening for cancer for at least 20 years after your treatment. If Pap tests have been discontinued, risk factors (such as a new sexual partner) need to be reassessed to determine if screening should be resumed. Some women have medical problems that increase the chance of getting cervical cancer. In these cases, your caregiver may recommend more frequent screening and Pap tests.   The human papillomavirus (HPV) test is an additional test that may be used for cervical cancer screening. The HPV test looks for the virus that can cause the cell changes on the cervix. The cells collected during the Pap test can be tested for HPV. The HPV test could be used to screen women aged 30 years and older, and should be used in women of any age who have unclear Pap test results. After the age of 30, women should have HPV testing at the same frequency as a Pap test.   Colorectal cancer can be detected and often prevented. Most routine colorectal cancer screening begins at the age of 50 and continues through age 75. However, your caregiver may recommend screening at an earlier age if you have risk factors for colon cancer. On a yearly basis, your caregiver may provide home test kits to check for hidden blood in the stool. Use of a small camera at  the end of a tube, to directly examine the colon (sigmoidoscopy or colonoscopy), can detect the earliest forms of colorectal cancer. Talk to your caregiver about this at age 50, when routine screening begins. Direct examination of the colon should be repeated every 5 to 10 years through age 75, unless early forms of pre-cancerous polyps or small growths are found.   Hepatitis C blood testing is recommended for all people born from 1945 through 1965 and any individual with known risks for hepatitis C.   Practice safe sex. Use condoms and avoid high-risk sexual practices to reduce the spread of sexually transmitted infections (STIs). Sexually active women aged 25 and younger should be checked for Chlamydia, which is a common sexually transmitted infection. Older women with new or multiple partners should also be tested for Chlamydia. Testing for other   STIs is recommended if you are sexually active and at increased risk.   Osteoporosis is a disease in which the bones lose minerals and strength with aging. This can result in serious bone fractures. The risk of osteoporosis can be identified using a bone density scan. Women ages 65 and over and women at risk for fractures or osteoporosis should discuss screening with their caregivers. Ask your caregiver whether you should be taking a calcium supplement or vitamin D to reduce the rate of osteoporosis.   Menopause can be associated with physical symptoms and risks. Hormone replacement therapy is available to decrease symptoms and risks. You should talk to your caregiver about whether hormone replacement therapy is right for you.   Use sunscreen with a sun protection factor (SPF) of 30 or greater. Apply sunscreen liberally and repeatedly throughout the day. You should seek shade when your shadow is shorter than you. Protect yourself by wearing long sleeves, pants, a wide-brimmed hat, and sunglasses year round, whenever you are outdoors.   Notify your caregiver  of new moles or changes in moles, especially if there is a change in shape or color. Also notify your caregiver if a mole is larger than the size of a pencil eraser.   Stay current with your immunizations.  Document Released: 11/21/2010 Document Revised: 04/27/2011 Document Reviewed: 11/21/2010 [employer] Patient Information 2012 [employer].                                                   Cholesterol Control Diet  Cholesterol levels in your body are determined significantly by your diet. Cholesterol levels may also be related to heart disease. The following material helps to explain this relationship and discusses what you can do to help keep your heart healthy. Not all cholesterol is bad. Low-density lipoprotein (LDL) cholesterol is the "bad" cholesterol. It may cause fatty deposits to build up inside your arteries. High-density lipoprotein (HDL) cholesterol is "good." It helps to remove the "bad" LDL cholesterol from your blood. Cholesterol is a very important risk factor for heart disease. Other risk factors are high blood pressure, smoking, stress, heredity, and weight. The heart muscle gets its supply of blood through the coronary arteries. If your LDL cholesterol is high and your HDL cholesterol is low, you are at risk for having fatty deposits build up in your coronary arteries. This leaves less room through which blood can flow. Without sufficient blood and oxygen, the heart muscle cannot function properly and you may feel chest pains (angina pectoris). When a coronary artery closes up entirely, a part of the heart muscle may die, causing a heart attack (myocardial infarction). CHECKING CHOLESTEROL When your caregiver sends your blood to a lab to be analyzed for cholesterol, a complete lipid (fat) profile may be done. With this test, the total amount of cholesterol and levels of LDL and HDL are determined. Triglycerides are a type of fat that circulates in the blood and can also be used to  determine heart disease risk. The list below describes what the numbers should be: Test: Total Cholesterol.  Less than 200 mg/dl.  Test: LDL "bad cholesterol."  Less than 100 mg/dl.   Less than 70 mg/dl if you are at very high risk of a heart attack or sudden cardiac death.  Test: HDL "good cholesterol."  Greater than 50 mg/dl for   women.   Greater than 40 mg/dl for men.  Test: Triglycerides.  Less than 150 mg/dl.  CONTROLLING CHOLESTEROL WITH DIET Although exercise and lifestyle factors are important, your diet is key. That is because certain foods are known to raise cholesterol and others to lower it. The goal is to balance foods for their effect on cholesterol and more importantly, to replace saturated and trans fat with other types of fat, such as monounsaturated fat, polyunsaturated fat, and omega-3 fatty acids. On average, a person should consume no more than 15 to 17 g of saturated fat daily. Saturated and trans fats are considered "bad" fats, and they will raise LDL cholesterol. Saturated fats are primarily found in animal products such as meats, butter, and cream. However, that does not mean you need to sacrifice all your favorite foods. Today, there are good tasting, low-fat, low-cholesterol substitutes for most of the things you like to eat. Choose low-fat or nonfat alternatives. Choose round or loin cuts of red meat, since these types of cuts are lowest in fat and cholesterol. Chicken (without the skin), fish, veal, and ground turkey breast are excellent choices. Eliminate fatty meats, such as hot dogs and salami. Even shellfish have little or no saturated fat. Have a 3 oz (85 g) portion when you eat lean meat, poultry, or fish. Trans fats are also called "partially hydrogenated oils." They are oils that have been scientifically manipulated so that they are solid at room temperature resulting in a longer shelf life and improved taste and texture of foods in which they are added. Trans  fats are found in stick margarine, some tub margarines, cookies, crackers, and baked goods.  When baking and cooking, oils are an excellent substitute for butter. The monounsaturated oils are especially beneficial since it is believed they lower LDL and raise HDL. The oils you should avoid entirely are saturated tropical oils, such as coconut and palm.  Remember to eat liberally from food groups that are naturally free of saturated and trans fat, including fish, fruit, vegetables, beans, grains (barley, rice, couscous, bulgur wheat), and pasta (without cream sauces).  IDENTIFYING FOODS THAT LOWER CHOLESTEROL  Soluble fiber may lower your cholesterol. This type of fiber is found in fruits such as apples, vegetables such as broccoli, potatoes, and carrots, legumes such as beans, peas, and lentils, and grains such as barley. Foods fortified with plant sterols (phytosterol) may also lower cholesterol. You should eat at least 2 g per day of these foods for a cholesterol lowering effect.  Read package labels to identify low-saturated fats, trans fats free, and low-fat foods at the supermarket. Select cheeses that have only 2 to 3 g saturated fat per ounce. Use a heart-healthy tub margarine that is free of trans fats or partially hydrogenated oil. When buying baked goods (cookies, crackers), avoid partially hydrogenated oils. Breads and muffins should be made from whole grains (whole-wheat or whole oat flour, instead of "flour" or "enriched flour"). Buy non-creamy canned soups with reduced salt and no added fats.  FOOD PREPARATION TECHNIQUES  Never deep-fry. If you must fry, either stir-fry, which uses very little fat, or use non-stick cooking sprays. When possible, broil, bake, or roast meats, and steam vegetables. Instead of dressing vegetables with butter or margarine, use lemon and herbs, applesauce and cinnamon (for squash and sweet potatoes), nonfat yogurt, salsa, and low-fat dressings for salads.    LOW-SATURATED FAT / LOW-FAT FOOD SUBSTITUTES Meats / Saturated Fat (g)  Avoid: Steak, marbled (3 oz/85 g) /   11 g   Choose: Steak, lean (3 oz/85 g) / 4 g   Avoid: Hamburger (3 oz/85 g) / 7 g   Choose: Hamburger, lean (3 oz/85 g) / 5 g   Avoid: Ham (3 oz/85 g) / 6 g   Choose: Ham, lean cut (3 oz/85 g) / 2.4 g   Avoid: Chicken, with skin, dark meat (3 oz/85 g) / 4 g   Choose: Chicken, skin removed, dark meat (3 oz/85 g) / 2 g   Avoid: Chicken, with skin, light meat (3 oz/85 g) / 2.5 g   Choose: Chicken, skin removed, light meat (3 oz/85 g) / 1 g  Dairy / Saturated Fat (g)  Avoid: Whole milk (1 cup) / 5 g   Choose: Low-fat milk, 2% (1 cup) / 3 g   Choose: Low-fat milk, 1% (1 cup) / 1.5 g   Choose: Skim milk (1 cup) / 0.3 g   Avoid: Hard cheese (1 oz/28 g) / 6 g   Choose: Skim milk cheese (1 oz/28 g) / 2 to 3 g   Avoid: Cottage cheese, 4% fat (1 cup) / 6.5 g   Choose: Low-fat cottage cheese, 1% fat (1 cup) / 1.5 g   Avoid: Ice cream (1 cup) / 9 g   Choose: Sherbet (1 cup) / 2.5 g   Choose: Nonfat frozen yogurt (1 cup) / 0.3 g   Choose: Frozen fruit bar / trace   Avoid: Whipped cream (1 tbs) / 3.5 g   Choose: Nondairy whipped topping (1 tbs) / 1 g  Condiments / Saturated Fat (g)  Avoid: Mayonnaise (1 tbs) / 2 g   Choose: Low-fat mayonnaise (1 tbs) / 1 g   Avoid: Butter (1 tbs) / 7 g   Choose: Extra light margarine (1 tbs) / 1 g   Avoid: Coconut oil (1 tbs) / 11.8 g   Choose: Olive oil (1 tbs) / 1.8 g   Choose: Corn oil (1 tbs) / 1.7 g   Choose: Safflower oil (1 tbs) / 1.2 g   Choose: Sunflower oil (1 tbs) / 1.4 g   Choose: Soybean oil (1 tbs) / 2.4 g   Choose: Canola oil (1 tbs) / 1 g  Document Released: 05/08/2005 Document Revised: 01/18/2011 Document Reviewed: 10/27/2010 [employer] Patient Information 2012 [employer].  Exercise to Lose Weight Exercise and a healthy diet may help you lose weight. Your doctor may suggest specific  exercises. EXERCISE IDEAS AND TIPS  Choose low-cost things you enjoy doing, such as walking, bicycling, or exercising to workout videos.   Take stairs instead of the elevator.   Walk during your lunch break.   Park your car further away from work or school.   Go to a gym or an exercise class.   Start with 5 to 10 minutes of exercise each day. Build up to 30 minutes of exercise 4 to 6 days a week.   Wear shoes with good support and comfortable clothes.   Stretch before and after working out.   Work out until you breathe harder and your heart beats faster.   Drink extra water when you exercise.   Do not do so much that you hurt yourself, feel dizzy, or get very short of breath.  Exercises that burn about 150 calories:  Running 1  miles in 15 minutes.   Playing volleyball for 45 to 60 minutes.   Washing and waxing a car for 45 to 60 minutes.   Playing touch football   for 45 minutes.   Walking 1  miles in 35 minutes.   Pushing a stroller 1  miles in 30 minutes.   Playing basketball for 30 minutes.   Raking leaves for 30 minutes.   Bicycling 5 miles in 30 minutes.   Walking 2 miles in 30 minutes.   Dancing for 30 minutes.   Shoveling snow for 15 minutes.   Swimming laps for 20 minutes.   Walking up stairs for 15 minutes.   Bicycling 4 miles in 15 minutes.   Gardening for 30 to 45 minutes.   Jumping rope for 15 minutes.   Washing windows or floors for 45 to 60 minutes.  Document Released: 06/10/2010 Document Revised: 01/18/2011 Document Reviewed: 06/10/2010 [employer] Patient Information 2012 [employer].  

## 2011-12-07 LAB — URINALYSIS W MICROSCOPIC + REFLEX CULTURE
Casts: NONE SEEN
Crystals: NONE SEEN
Glucose, UA: NEGATIVE mg/dL
Ketones, ur: NEGATIVE mg/dL
Leukocytes, UA: NEGATIVE
Nitrite: NEGATIVE
Specific Gravity, Urine: 1.019 (ref 1.005–1.030)
pH: 5.5 (ref 5.0–8.0)

## 2011-12-07 NOTE — Telephone Encounter (Signed)
Patient called and was informed "IUD in place and u/s was normal." per Dr. Glenetta Hew.

## 2011-12-09 ENCOUNTER — Emergency Department: Payer: Self-pay | Admitting: Emergency Medicine

## 2011-12-10 LAB — CBC
MCV: 86 fL (ref 80–100)
Platelet: 307 10*3/uL (ref 150–440)
RBC: 5.06 10*6/uL (ref 3.80–5.20)
RDW: 14.6 % — ABNORMAL HIGH (ref 11.5–14.5)
WBC: 6 10*3/uL (ref 3.6–11.0)

## 2011-12-10 LAB — COMPREHENSIVE METABOLIC PANEL
Albumin: 3.9 g/dL (ref 3.4–5.0)
Anion Gap: 6 — ABNORMAL LOW (ref 7–16)
Calcium, Total: 8.8 mg/dL (ref 8.5–10.1)
Chloride: 108 mmol/L — ABNORMAL HIGH (ref 98–107)
Co2: 29 mmol/L (ref 21–32)
Creatinine: 0.78 mg/dL (ref 0.60–1.30)
EGFR (Non-African Amer.): >60
Glucose: 90 mg/dL (ref 65–99)
Osmolality: 284 (ref 275–301)
Potassium: 3.8 mmol/L (ref 3.5–5.1)
Sodium: 143 mmol/L (ref 136–145)

## 2011-12-10 LAB — URINALYSIS, COMPLETE
Bilirubin,UR: NEGATIVE
Ketone: NEGATIVE
Ph: 5 (ref 4.5–8.0)
Protein: NEGATIVE
Squamous Epithelial: 2
WBC UR: 5 /HPF (ref 0–5)

## 2011-12-10 LAB — DRUG SCREEN, URINE
Amphetamines, Ur Screen: NEGATIVE (ref ?–1000)
Benzodiazepine, Ur Scrn: NEGATIVE (ref ?–200)
Cannabinoid 50 Ng, Ur ~~LOC~~: NEGATIVE (ref ?–50)
Cocaine Metabolite,Ur ~~LOC~~: NEGATIVE (ref ?–300)
MDMA (Ecstasy)Ur Screen: NEGATIVE (ref ?–500)
Methadone, Ur Screen: NEGATIVE (ref ?–300)
Phencyclidine (PCP) Ur S: NEGATIVE (ref ?–25)

## 2011-12-10 LAB — PREGNANCY, URINE: Pregnancy Test, Urine: NEGATIVE m[IU]/mL

## 2011-12-10 LAB — TROPONIN I: Troponin-I: 0.02 ng/mL

## 2012-12-09 ENCOUNTER — Encounter: Payer: Self-pay | Admitting: Gynecology

## 2012-12-09 ENCOUNTER — Ambulatory Visit (INDEPENDENT_AMBULATORY_CARE_PROVIDER_SITE_OTHER): Payer: Private Health Insurance - Indemnity | Admitting: Gynecology

## 2012-12-09 VITALS — BP 134/92 | Ht 67.25 in | Wt 268.0 lb

## 2012-12-09 DIAGNOSIS — Z01419 Encounter for gynecological examination (general) (routine) without abnormal findings: Secondary | ICD-10-CM

## 2012-12-09 DIAGNOSIS — N760 Acute vaginitis: Secondary | ICD-10-CM

## 2012-12-09 DIAGNOSIS — N898 Other specified noninflammatory disorders of vagina: Secondary | ICD-10-CM

## 2012-12-09 DIAGNOSIS — B9689 Other specified bacterial agents as the cause of diseases classified elsewhere: Secondary | ICD-10-CM

## 2012-12-09 DIAGNOSIS — A499 Bacterial infection, unspecified: Secondary | ICD-10-CM

## 2012-12-09 DIAGNOSIS — Z23 Encounter for immunization: Secondary | ICD-10-CM

## 2012-12-09 LAB — WET PREP FOR TRICH, YEAST, CLUE

## 2012-12-09 MED ORDER — METRONIDAZOLE 500 MG PO TABS: 500.0000 mg | ORAL_TABLET | Freq: Two times a day (BID) | ORAL | Status: DC

## 2012-12-09 NOTE — Addendum Note (Signed)
Addended by: Bertram Savin A on: 12/09/2012 10:26 AM   Modules accepted: Orders

## 2012-12-09 NOTE — Progress Notes (Signed)
Kristen Shepherd [birth year] [id number]   History:    36 y.o.  for annual gyn exam with no complaints today. Patient had a Mirena IUD placed in 2012 has done well. Prior to that she had resectoscopic myomectomy. She has done well and is having no menses. Patient for glucose her breast exam. Patient with no prior history of abnormal Pap smears. Patient is exercising and eating healthier and has lost 20 pounds his last year. Her primary care physician is in [facility] who is been monitoring her blood pressure and doing her lab work. She is currently taking lisinopril and has done well. Patient has never received the Tdap vaccine.  Past medical history,surgical history, family history and social history were all reviewed and documented in the EPIC chart.  Gynecologic History No LMP recorded. Patient is not currently having periods (Reason: IUD). Contraception: IUD Last Pap: 2013. Results were: normal Last mammogram: none indicated. Results were: plan indicated  Obstetric History OB History   Grav Para Term Preterm Abortions TAB SAB Ect Mult Living   0                ROS: A ROS was performed and pertinent positives and negatives are included in the history.  GENERAL: No fevers or chills. HEENT: No change in vision, no earache, sore throat or sinus congestion. NECK: No pain or stiffness. CARDIOVASCULAR: No chest pain or pressure. No palpitations. PULMONARY: No shortness of breath, cough or wheeze. GASTROINTESTINAL: No abdominal pain, nausea, vomiting or diarrhea, melena or bright red blood per rectum. GENITOURINARY: No urinary frequency, urgency, hesitancy or dysuria. MUSCULOSKELETAL: No joint or muscle pain, no back pain, no recent trauma. DERMATOLOGIC: No rash, no itching, no lesions. ENDOCRINE: No polyuria, polydipsia, no heat or cold intolerance. No recent change in weight. HEMATOLOGICAL: No anemia or easy bruising or bleeding. NEUROLOGIC: No headache, seizures, numbness, tingling or  weakness. PSYCHIATRIC: No depression, no loss of interest in normal activity or change in sleep pattern.     Exam: chaperone present  BP 134/92  Ht 5' 7.25" (1.708 m)  Wt 268 lb (121.564 kg)  BMI 41.67 kg/m2  Body mass index is 41.67 kg/(m^2).  General appearance : Well developed well nourished female. No acute distress HEENT: Neck supple, trachea midline, no carotid bruits, no thyroidmegaly Lungs: Clear to auscultation, no rhonchi or wheezes, or rib retractions  Heart: Regular rate and rhythm, no murmurs or gallops Breast:Examined in sitting and supine position were symmetrical in appearance, no palpable masses or tenderness,  no skin retraction, no nipple inversion, no nipple discharge, no skin discoloration, no axillary or supraclavicular lymphadenopathy Abdomen: no palpable masses or tenderness, no rebound or guarding Extremities: no edema or skin discoloration or tenderness  Pelvic:  Bartholin, Urethra, Skene Glands: Within normal limits             Vagina: No gross lesions, bubbly white discharge noted Cervix: No gross lesions or discharge  Uterus  anteverted, normal size, shape and consistency, non-tender and mobile  Adnexa  Without masses or tenderness  Anus and perineum  normal   Rectovaginal  normal sphincter tone without palpated masses or tenderness             Hemoccult not indicated   Wet prep: Positive Amine, moderate clue cell, many WBC, too numerous to count bacteria  Assessment/Plan:  37 y.o. female for annual exam with incidental finding of bacterial vaginosis. Flagyl 500 mg 1 by mouth twice a day will be prescribed for 5  days. She received a Tdap vaccine today. We discussed importance of monthly self breast examinations. No labs done today her primary physician is doing her lab work. No Pap smear done today in accordance with the new guidelines.    Ok Edwards MD, 10:21 AM 12/09/2012

## 2012-12-09 NOTE — Patient Instructions (Addendum)
Tetanus, Diphtheria, Pertussis (Tdap) Vaccine What You Need to Know WHY GET VACCINATED? Tetanus, diphtheria and pertussis can be very serious diseases, even for adolescents and adults. Tdap vaccine can protect us from these diseases. TETANUS (Lockjaw) causes painful muscle tightening and stiffness, usually all over the body.  It can lead to tightening of muscles in the head and neck so you can't open your mouth, swallow, or sometimes even breathe. Tetanus kills about 1 out of 5 people who are infected. DIPHTHERIA can cause a thick coating to form in the back of the throat.  It can lead to breathing problems, paralysis, heart failure, and death. PERTUSSIS (Whooping Cough) causes severe coughing spells, which can cause difficulty breathing, vomiting and disturbed sleep.  It can also lead to weight loss, incontinence, and rib fractures. Up to 2 in 100 adolescents and 5 in 100 adults with pertussis are hospitalized or have complications, which could include pneumonia and death. These diseases are caused by bacteria. Diphtheria and pertussis are spread from person to person through coughing or sneezing. Tetanus enters the body through cuts, scratches, or wounds. Before vaccines, the [place] saw as many as 200,000 cases a year of diphtheria and pertussis, and hundreds of cases of tetanus. Since vaccination began, tetanus and diphtheria have dropped by about 99% and pertussis by about 80%. TDAP VACCINE Tdap vaccine can protect adolescents and adults from tetanus, diphtheria, and pertussis. One dose of Tdap is routinely given at age 11 or 12. People who did not get Tdap at that age should get it as soon as possible. Tdap is especially important for health care professionals and anyone having close contact with a baby younger than 12 months. Pregnant women should get a dose of Tdap during every pregnancy, to protect the newborn from pertussis. Infants are most at risk for severe, life-threatening  complications from pertussis. A similar vaccine, called Td, protects from tetanus and diphtheria, but not pertussis. A Td booster should be given every 10 years. Tdap may be given as one of these boosters if you have not already gotten a dose. Tdap may also be given after a severe cut or burn to prevent tetanus infection. Your doctor can give you more information. Tdap may safely be given at the same time as other vaccines. SOME PEOPLE SHOULD NOT GET THIS VACCINE  If you ever had a life-threatening allergic reaction after a dose of any tetanus, diphtheria, or pertussis containing vaccine, OR if you have a severe allergy to any part of this vaccine, you should not get Tdap. Tell your doctor if you have any severe allergies.  If you had a coma, or long or multiple seizures within 7 days after a childhood dose of DTP or DTaP, you should not get Tdap, unless a cause other than the vaccine was found. You can still get Td.  Talk to your doctor if you:  have epilepsy or another nervous system problem,  had severe pain or swelling after any vaccine containing diphtheria, tetanus or pertussis,  ever had Guillain-Barr Syndrome (GBS),  aren't feeling well on the day the shot is scheduled. RISKS OF A VACCINE REACTION With any medicine, including vaccines, there is a chance of side effects. These are usually mild and go away on their own, but serious reactions are also possible. Brief fainting spells can follow a vaccination, leading to injuries from falling. Sitting or lying down for about 15 minutes can help prevent these. Tell your doctor if you feel dizzy or light-headed, or   have vision changes or ringing in the ears. Mild problems following Tdap (Did not interfere with activities)  Pain where the shot was given (about 3 in 4 adolescents or 2 in 3 adults)  Redness or swelling where the shot was given (about 1 person in 5)  Mild fever of at least 100.4F (up to about 1 in 25 adolescents or 1 in  100 adults)  Headache (about 3 or 4 people in 10)  Tiredness (about 1 person in 3 or 4)  Nausea, vomiting, diarrhea, stomach ache (up to 1 in 4 adolescents or 1 in 10 adults)  Chills, body aches, sore joints, rash, swollen glands (uncommon) Moderate problems following Tdap (Interfered with activities, but did not require medical attention)  Pain where the shot was given (about 1 in 5 adolescents or 1 in 100 adults)  Redness or swelling where the shot was given (up to about 1 in 16 adolescents or 1 in 25 adults)  Fever over 102F (about 1 in 100 adolescents or 1 in 250 adults)  Headache (about 3 in 20 adolescents or 1 in 10 adults)  Nausea, vomiting, diarrhea, stomach ache (up to 1 or 3 people in 100)  Swelling of the entire arm where the shot was given (up to about 3 in 100). Severe problems following Tdap (Unable to perform usual activities, required medical attention)  Swelling, severe pain, bleeding and redness in the arm where the shot was given (rare). A severe allergic reaction could occur after any vaccine (estimated less than 1 in a million doses). WHAT IF THERE IS A SERIOUS REACTION? What should I look for?  Look for anything that concerns you, such as signs of a severe allergic reaction, very high fever, or behavior changes. Signs of a severe allergic reaction can include hives, swelling of the face and throat, difficulty breathing, a fast heartbeat, dizziness, and weakness. These would start a few minutes to a few hours after the vaccination. What should I do?  If you think it is a severe allergic reaction or other emergency that can't wait, call 9-1-1 or get the person to the nearest hospital. Otherwise, call your doctor.  Afterward, the reaction should be reported to the "Vaccine Adverse Event Reporting System" (VAERS). Your doctor might file this report, or you can do it yourself through the VAERS web site at www.vaers.hhs.gov, or by calling 1-800-822-7967. VAERS is  only for reporting reactions. They do not give medical advice.  THE NATIONAL VACCINE INJURY COMPENSATION PROGRAM The National Vaccine Injury Compensation Program (VICP) is a federal program that was created to compensate people who may have been injured by certain vaccines. Persons who believe they may have been injured by a vaccine can learn about the program and about filing a claim by calling 1-800-338-2382 or visiting the VICP website at www.hrsa.gov/vaccinecompensation. HOW CAN I LEARN MORE?  Ask your doctor.  Call your local or state health department.  Contact the Centers for Disease Control and Prevention (CDC):  Call 1-800-232-4636 or visit CDC's website at www.cdc.gov/vaccines. CDC Tdap Vaccine VIS (09/28/11) Document Released: 11/07/2011 Document Revised: 01/31/2012 Document Reviewed: 11/07/2011 ExitCare Patient Information 2014 [employer]. Bacterial Vaginosis Bacterial vaginosis (BV) is a vaginal infection where the normal balance of bacteria in the vagina is disrupted. The normal balance is then replaced by an overgrowth of certain bacteria. There are several different kinds of bacteria that can cause BV. BV is the most common vaginal infection in women of childbearing age. CAUSES   The cause of   BV is not fully understood. BV develops when there is an increase or imbalance of harmful bacteria.  Some activities or behaviors can upset the normal balance of bacteria in the vagina and put women at increased risk including:  Having a new sex partner or multiple sex partners.  Douching.  Using an intrauterine device (IUD) for contraception.  It is not clear what role sexual activity plays in the development of BV. However, women that have never had sexual intercourse are rarely infected with BV. Women do not get BV from toilet seats, bedding, swimming pools or from touching objects around them.  SYMPTOMS   Grey vaginal discharge.  A fish-like odor with discharge,  especially after sexual intercourse.  Itching or burning of the vagina and vulva.  Burning or pain with urination.  Some women have no signs or symptoms at all. DIAGNOSIS  Your caregiver must examine the vagina for signs of BV. Your caregiver will perform lab tests and look at the sample of vaginal fluid through a microscope. They will look for bacteria and abnormal cells (clue cells), a pH test higher than 4.5, and a positive amine test all associated with BV.  RISKS AND COMPLICATIONS   Pelvic inflammatory disease (PID).  Infections following gynecology surgery.  Developing HIV.  Developing herpes virus. TREATMENT  Sometimes BV will clear up without treatment. However, all women with symptoms of BV should be treated to avoid complications, especially if gynecology surgery is planned. Female partners generally do not need to be treated. However, BV may spread between female sex partners so treatment is helpful in preventing a recurrence of BV.   BV may be treated with antibiotics. The antibiotics come in either pill or vaginal cream forms. Either can be used with nonpregnant or pregnant women, but the recommended dosages differ. These antibiotics are not harmful to the baby.  BV can recur after treatment. If this happens, a second round of antibiotics will often be prescribed.  Treatment is important for pregnant women. If not treated, BV can cause a premature delivery, especially for a pregnant woman who had a premature birth in the past. All pregnant women who have symptoms of BV should be checked and treated.  For chronic reoccurrence of BV, treatment with a type of prescribed gel vaginally twice a week is helpful. HOME CARE INSTRUCTIONS   Finish all medication as directed by your caregiver.  Do not have sex until treatment is completed.  Tell your sexual partner that you have a vaginal infection. They should see their caregiver and be treated if they have problems, such as a mild  rash or itching.  Practice safe sex. Use condoms. Only have 1 sex partner. PREVENTION  Basic prevention steps can help reduce the risk of upsetting the natural balance of bacteria in the vagina and developing BV:  Do not have sexual intercourse (be abstinent).  Do not douche.  Use all of the medicine prescribed for treatment of BV, even if the signs and symptoms go away.  Tell your sex partner if you have BV. That way, they can be treated, if needed, to prevent reoccurrence. SEEK MEDICAL CARE IF:   Your symptoms are not improving after 3 days of treatment.  You have increased discharge, pain, or fever. MAKE SURE YOU:   Understand these instructions.  Will watch your condition.  Will get help right away if you are not doing well or get worse. FOR MORE INFORMATION  Division of STD Prevention (DSTDP), Centers for Disease Control   and Prevention: www.cdc.gov/std American Social Health Association (ASHA): www.ashastd.org  Document Released: 05/08/2005 Document Revised: 07/31/2011 Document Reviewed: 10/29/2008 ExitCare Patient Information 2014 [employer].  

## 2013-06-12 ENCOUNTER — Emergency Department: Payer: Self-pay | Admitting: Emergency Medicine

## 2013-06-12 LAB — BASIC METABOLIC PANEL
Anion Gap: 3 — ABNORMAL LOW (ref 7–16)
BUN: 9 mg/dL (ref 7–18)
CALCIUM: 9.1 mg/dL (ref 8.5–10.1)
CREATININE: 0.8 mg/dL (ref 0.60–1.30)
Chloride: 105 mmol/L (ref 98–107)
Co2: 30 mmol/L (ref 21–32)
Glucose: 82 mg/dL (ref 65–99)
Osmolality: 273 (ref 275–301)
Potassium: 3.5 mmol/L (ref 3.5–5.1)
Sodium: 138 mmol/L (ref 136–145)

## 2013-06-12 LAB — CBC
HCT: 43.2 % (ref 35.0–47.0)
HGB: 14.1 g/dL (ref 12.0–16.0)
MCH: 27.4 pg (ref 26.0–34.0)
MCHC: 32.5 g/dL (ref 32.0–36.0)
MCV: 84 fL (ref 80–100)
Platelet: 269 10*3/uL (ref 150–440)
RBC: 5.13 10*6/uL (ref 3.80–5.20)
RDW: 14.3 % (ref 11.5–14.5)
WBC: 6.9 10*3/uL (ref 3.6–11.0)

## 2014-12-10 ENCOUNTER — Other Ambulatory Visit (HOSPITAL_COMMUNITY)
Admission: RE | Admit: 2014-12-10 | Discharge: 2014-12-10 | Disposition: A | Discharge status: Home / Self Care | Payer: Private Health Insurance - Indemnity | Source: Ambulatory Visit | Attending: Gynecology | Admitting: Gynecology

## 2014-12-10 ENCOUNTER — Encounter: Payer: Self-pay | Admitting: Gynecology

## 2014-12-10 ENCOUNTER — Ambulatory Visit (INDEPENDENT_AMBULATORY_CARE_PROVIDER_SITE_OTHER): Payer: Managed Care, Other (non HMO) | Admitting: Gynecology

## 2014-12-10 VITALS — BP 130/90 | Ht 67.25 in | Wt 290.0 lb

## 2014-12-10 DIAGNOSIS — Z01411 Encounter for gynecological examination (general) (routine) with abnormal findings: Secondary | ICD-10-CM | POA: Insufficient documentation

## 2014-12-10 DIAGNOSIS — Z01419 Encounter for gynecological examination (general) (routine) without abnormal findings: Secondary | ICD-10-CM | POA: Diagnosis not present

## 2014-12-10 DIAGNOSIS — Z30431 Encounter for routine checking of intrauterine contraceptive device: Secondary | ICD-10-CM

## 2014-12-10 DIAGNOSIS — Z1151 Encounter for screening for human papillomavirus (HPV): Secondary | ICD-10-CM | POA: Insufficient documentation

## 2014-12-10 NOTE — Progress Notes (Signed)
Kristen Shepherd 07-15-1975 [id number]   History:    39 y.o.  for annual gyn exam with the only complaint is that the past 3 months her menstrual cycles have lasted 10 days with passage of large clots. Patient had a Mirena IUD placed in 2012 but prior to that she had a resectoscopic myomectomy. Patient with no previous history of any abnormal Pap smear.Her primary care physician is in [facility] who is been monitoring her blood pressure and doing her lab work. She is currently taking lisinopril and has done well.   Past medical history,surgical history, family history and social history were all reviewed and documented in the EPIC chart.  Gynecologic History No LMP recorded. Patient is not currently having periods (Reason: IUD). Contraception: IUD Last Pap: 2012. Results were: normal Last mammogram: Not indicated. Results were: Not indicated  Obstetric History OB History  Gravida Para Term Preterm AB SAB TAB Ectopic Multiple Living  0                  ROS: A ROS was performed and pertinent positives and negatives are included in the history.  GENERAL: No fevers or chills. HEENT: No change in vision, no earache, sore throat or sinus congestion. NECK: No pain or stiffness. CARDIOVASCULAR: No chest pain or pressure. No palpitations. PULMONARY: No shortness of breath, cough or wheeze. GASTROINTESTINAL: No abdominal pain, nausea, vomiting or diarrhea, melena or bright red blood per rectum. GENITOURINARY: No urinary frequency, urgency, hesitancy or dysuria. MUSCULOSKELETAL: No joint or muscle pain, no back pain, no recent trauma. DERMATOLOGIC: No rash, no itching, no lesions. ENDOCRINE: No polyuria, polydipsia, no heat or cold intolerance. No recent change in weight. HEMATOLOGICAL: No anemia or easy bruising or bleeding. NEUROLOGIC: No headache, seizures, numbness, tingling or weakness. PSYCHIATRIC: No depression, no loss of interest in normal activity or change in sleep pattern.       Exam: chaperone present  BP 130/90 mmHg  Ht 5' 7.25" (1.708 m)  Wt 290 lb (131.543 kg)  BMI 45.09 kg/m2  Body mass index is 45.09 kg/(m^2).  General appearance : Well developed well nourished female. No acute distress HEENT: Eyes: no retinal hemorrhage or exudates,  Neck supple, trachea midline, no carotid bruits, no thyroidmegaly Lungs: Clear to auscultation, no rhonchi or wheezes, or rib retractions  Heart: Regular rate and rhythm, no murmurs or gallops Breast:Examined in sitting and supine position were symmetrical in appearance, no palpable masses or tenderness,  no skin retraction, no nipple inversion, no nipple discharge, no skin discoloration, no axillary or supraclavicular lymphadenopathy Abdomen: no palpable masses or tenderness, no rebound or guarding Extremities: no edema or skin discoloration or tenderness  Pelvic:  Bartholin, Urethra, Skene Glands: Within normal limits             Vagina: No gross lesions or discharge  Cervix: No gross lesions or discharge IUD string not visualized  Uterus  anteverted, normal size, shape and consistency, non-tender and mobile  Adnexa  Without masses or tenderness  Anus and perineum  normal   Rectovaginal  normal sphincter tone without palpated masses or tenderness             Hemoccult not indicated     Assessment/Plan:  39 y.o. female for annual exam patient will return back to the office next week for an ultrasound to reassure herself that the IUD is still present since she her last 3 menstrual cycles have lasted up to 10 days and the IUD  string was not visualized. Her PCP has been doing her blood work. Her Pap smear with HPV screening was done today. Patient will need a mammogram next year. We discussed importance of regular exercise and healthy diet. She stated she has joined a gym an effort to lose weight. She scheduled and see her PCP next week.   Terrance Mass MD, 4:01 PM 12/10/2014

## 2014-12-10 NOTE — Addendum Note (Signed)
Addended by: Thurnell Garbe A on: 12/10/2014 04:08 PM   Modules accepted: Orders, SmartSet

## 2014-12-14 ENCOUNTER — Ambulatory Visit: Payer: Managed Care, Other (non HMO) | Admitting: Gynecology

## 2014-12-14 ENCOUNTER — Other Ambulatory Visit: Payer: Managed Care, Other (non HMO)

## 2014-12-14 LAB — CYTOLOGY - PAP

## 2015-01-04 ENCOUNTER — Ambulatory Visit (INDEPENDENT_AMBULATORY_CARE_PROVIDER_SITE_OTHER): Payer: Managed Care, Other (non HMO) | Admitting: Gynecology

## 2015-01-04 ENCOUNTER — Encounter: Payer: Self-pay | Admitting: Gynecology

## 2015-01-04 ENCOUNTER — Ambulatory Visit (INDEPENDENT_AMBULATORY_CARE_PROVIDER_SITE_OTHER): Payer: Managed Care, Other (non HMO)

## 2015-01-04 VITALS — BP 134/80

## 2015-01-04 DIAGNOSIS — N920 Excessive and frequent menstruation with regular cycle: Secondary | ICD-10-CM

## 2015-01-04 DIAGNOSIS — Z30431 Encounter for routine checking of intrauterine contraceptive device: Secondary | ICD-10-CM | POA: Diagnosis not present

## 2015-01-04 DIAGNOSIS — D251 Intramural leiomyoma of uterus: Secondary | ICD-10-CM

## 2015-01-04 MED ORDER — IBUPROFEN 800 MG PO TABS: 800.0000 mg | ORAL_TABLET | Freq: Three times a day (TID) | ORAL | Status: DC | PRN

## 2015-01-04 NOTE — Progress Notes (Signed)
   Patient is a 39 year old who was seen the office in July 21 for her annual gynecological examination patient stated that her previous 3 months her cycles were heavy and lasting up to 10 days with passage of large clots. She had a Mirena IUD placed in 2012 and has done well. She also has had history of resectoscopic myomectomy in the past. She denies any intermenstrual bleeding. Her Pap smear on that office visit was normal. Patient was asymptomatic today. Patient is here today discussing ultrasound.  Ultrasound today: Uterus measured 8.5 x 5.5 x 4.4 cm with endometrial stripe of 5.6 mm. 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 The IUD was found in normal position. Right and left ovary with numerous follicles consistent with PCOS  Assessment/plan: Patient was reassured that her ultrasound was normal with exception of small intramural myoma. I'm prescribing Motrin 800 mg 3 times a day to help her dysmenorrhea and heavy cycle. She's otherwise scheduled to return back to the office in one year or when necessary. Greater than 50% was spent in calcium [address] of care for this patient.

## 2015-01-04 NOTE — Patient Instructions (Signed)
Fibroids Fibroids are lumps (tumors) that can occur any place in a woman's body. These lumps are not cancerous. Fibroids vary in size, weight, and where they grow. HOME CARE  Do not take aspirin.  Write down the number of pads or tampons you use during your period. Tell your doctor. This can help determine the best treatment for you. GET HELP RIGHT AWAY IF:  You have pain in your lower belly (abdomen) that is not helped with medicine.  You have cramps that are not helped with medicine.  You have more bleeding between or during your period.  You feel lightheaded or pass out (faint).  Your lower belly pain gets worse. MAKE SURE YOU:  Understand these instructions.  Will watch your condition.  Will get help right away if you are not doing well or get worse. Document Released: 06/10/2010 Document Revised: 07/31/2011 Document Reviewed: 06/10/2010 ExitCare Patient Information 2015 [employer]. This information is not intended to replace advice given to you by your health care provider. Make sure you discuss any questions you have with your health care provider.  

## 2015-02-24 DIAGNOSIS — R519 Headache, unspecified: Secondary | ICD-10-CM | POA: Insufficient documentation

## 2016-02-01 ENCOUNTER — Ambulatory Visit (INDEPENDENT_AMBULATORY_CARE_PROVIDER_SITE_OTHER): Payer: Managed Care, Other (non HMO) | Admitting: Gynecology

## 2016-02-01 ENCOUNTER — Encounter: Payer: Self-pay | Admitting: Gynecology

## 2016-02-01 VITALS — BP 134/86 | Ht 67.5 in | Wt 303.0 lb

## 2016-02-01 DIAGNOSIS — Z01419 Encounter for gynecological examination (general) (routine) without abnormal findings: Secondary | ICD-10-CM | POA: Diagnosis not present

## 2016-02-01 NOTE — Progress Notes (Signed)
CAMARIA NICKLOW [birth year] [id number]   History:    40 y.o.  for annual gyn exam who is now been with a Mirena IUD for 5 years and was undecided with her to remove it today or return back another day to have it replaced or attempt to get pregnant. She has never been pregnant before. The Mirena IUD has helped not only for contraception but to control her menorrhagia she has a very light cycle if any and has been happy with this form of contraception and cycle control. Patient several years ago had a resectoscopic myomectomy. Patient with no past history of any abnormal Pap smear.Her primary care physician is in [facility] who is been monitoring her blood pressure and doing her lab work. She is currently taking lisinopril and has done well. Patient declined flu vaccine today.  Past medical history,surgical history, family history and social history were all reviewed and documented in the EPIC chart.  Gynecologic History No LMP recorded. Patient is not currently having periods (Reason: IUD). Contraception: IUD Last Pap: 2013 in 2016. Results were: normal Last mammogram: No previous study. Results were: No previous study  Obstetric History OB History  Gravida Para Term Preterm AB Living  0            SAB TAB Ectopic Multiple Live Births                    ROS: A ROS was performed and pertinent positives and negatives are included in the history.  GENERAL: No fevers or chills. HEENT: No change in vision, no earache, sore throat or sinus congestion. NECK: No pain or stiffness. CARDIOVASCULAR: No chest pain or pressure. No palpitations. PULMONARY: No shortness of breath, cough or wheeze. GASTROINTESTINAL: No abdominal pain, nausea, vomiting or diarrhea, melena or bright red blood per rectum. GENITOURINARY: No urinary frequency, urgency, hesitancy or dysuria. MUSCULOSKELETAL: No joint or muscle pain, no back pain, no recent trauma. DERMATOLOGIC: No rash, no itching, no lesions.  ENDOCRINE: No polyuria, polydipsia, no heat or cold intolerance. No recent change in weight. HEMATOLOGICAL: No anemia or easy bruising or bleeding. NEUROLOGIC: No headache, seizures, numbness, tingling or weakness. PSYCHIATRIC: No depression, no loss of interest in normal activity or change in sleep pattern.     Exam: chaperone present  BP 134/86   Ht 5' 7.5" (1.715 m)   Wt (!) 303 lb (137.4 kg)   BMI 46.76 kg/m   Body mass index is 46.76 kg/m.  General appearance : Well developed well nourished female. No acute distress HEENT: Eyes: no retinal hemorrhage or exudates,  Neck supple, trachea midline, no carotid bruits, no thyroidmegaly Lungs: Clear to auscultation, no rhonchi or wheezes, or rib retractions  Heart: Regular rate and rhythm, no murmurs or gallops Breast:Examined in sitting and supine position were symmetrical in appearance, no palpable masses or tenderness,  no skin retraction, no nipple inversion, no nipple discharge, no skin discoloration, no axillary or supraclavicular lymphadenopathy Abdomen: no palpable masses or tenderness, no rebound or guarding Extremities: no edema or skin discoloration or tenderness  Pelvic:  Bartholin, Urethra, Skene Glands: Within normal limits             Vagina: No gross lesions or discharge  Cervix: No gross lesions or discharge  Uterus  anteverted, normal size, shape and consistency, non-tender and mobile  Adnexa  Without masses or tenderness  Anus and perineum  normal   Rectovaginal  normal sphincter tone without palpated masses  or tenderness             Hemoccult not indicated     Assessment/Plan:  40 y.o. female for annual exam who not certain of whether tend to get pregnant or change her IUD. We discussed that fertility decreases after the age of 57 but the risk of aneuploidy increases. We also discussed potential risks when she does conceive besides aneuploidy to include the following: Maternal hypertension, premature delivery,  miscarriage, diabetes were discussed. Her husband was present they will think about it and return back here to remove the IUD and attempt pregnancy or change to IUD to a new one. Pap smear not indicated this year. Patient declined flu vaccine. PCP doing her blood work. She was provided with a requisition to schedule her baseline mammogram.   Terrance Mass MD, 3:55 PM 02/01/2016

## 2016-02-02 ENCOUNTER — Other Ambulatory Visit: Payer: Self-pay | Admitting: Family Medicine

## 2016-02-02 DIAGNOSIS — Z1231 Encounter for screening mammogram for malignant neoplasm of breast: Secondary | ICD-10-CM

## 2016-02-10 ENCOUNTER — Ambulatory Visit
Admission: RE | Admit: 2016-02-10 | Discharge: 2016-02-10 | Disposition: A | Discharge status: Home / Self Care | Payer: Managed Care, Other (non HMO) | Source: Ambulatory Visit | Attending: Family Medicine | Admitting: Family Medicine

## 2016-02-10 DIAGNOSIS — Z1231 Encounter for screening mammogram for malignant neoplasm of breast: Secondary | ICD-10-CM | POA: Insufficient documentation

## 2016-03-08 DIAGNOSIS — M2550 Pain in unspecified joint: Secondary | ICD-10-CM | POA: Insufficient documentation

## 2016-03-08 DIAGNOSIS — E559 Vitamin D deficiency, unspecified: Secondary | ICD-10-CM | POA: Insufficient documentation

## 2016-03-08 DIAGNOSIS — R768 Other specified abnormal immunological findings in serum: Secondary | ICD-10-CM | POA: Insufficient documentation

## 2016-08-07 DIAGNOSIS — M25569 Pain in unspecified knee: Secondary | ICD-10-CM | POA: Insufficient documentation

## 2016-10-04 ENCOUNTER — Encounter: Payer: Self-pay | Admitting: Gynecology

## 2018-06-13 DIAGNOSIS — M792 Neuralgia and neuritis, unspecified: Secondary | ICD-10-CM | POA: Insufficient documentation

## 2018-06-27 DIAGNOSIS — R937 Abnormal findings on diagnostic imaging of other parts of musculoskeletal system: Secondary | ICD-10-CM | POA: Insufficient documentation

## 2018-07-01 ENCOUNTER — Other Ambulatory Visit: Payer: Self-pay | Admitting: Internal Medicine

## 2018-07-01 ENCOUNTER — Other Ambulatory Visit (HOSPITAL_COMMUNITY): Payer: Self-pay | Admitting: Internal Medicine

## 2018-07-01 DIAGNOSIS — M792 Neuralgia and neuritis, unspecified: Secondary | ICD-10-CM

## 2018-07-01 DIAGNOSIS — M5412 Radiculopathy, cervical region: Secondary | ICD-10-CM

## 2018-07-08 ENCOUNTER — Ambulatory Visit
Admission: RE | Admit: 2018-07-08 | Discharge: 2018-07-08 | Disposition: A | Discharge status: Home / Self Care | Payer: 59 | Source: Ambulatory Visit | Attending: Internal Medicine | Admitting: Internal Medicine

## 2018-07-08 DIAGNOSIS — M792 Neuralgia and neuritis, unspecified: Secondary | ICD-10-CM | POA: Insufficient documentation

## 2018-07-08 DIAGNOSIS — M5412 Radiculopathy, cervical region: Secondary | ICD-10-CM | POA: Insufficient documentation

## 2018-07-18 ENCOUNTER — Encounter: Payer: Self-pay | Admitting: Gynecology

## 2018-07-18 ENCOUNTER — Ambulatory Visit: Payer: 59 | Admitting: Gynecology

## 2018-07-18 VITALS — BP 134/84 | Ht 68.0 in | Wt 306.0 lb

## 2018-07-18 DIAGNOSIS — Z30431 Encounter for routine checking of intrauterine contraceptive device: Secondary | ICD-10-CM

## 2018-07-18 DIAGNOSIS — Z01419 Encounter for gynecological examination (general) (routine) without abnormal findings: Secondary | ICD-10-CM | POA: Diagnosis not present

## 2018-07-18 DIAGNOSIS — Z1151 Encounter for screening for human papillomavirus (HPV): Secondary | ICD-10-CM

## 2018-07-18 NOTE — Patient Instructions (Signed)
Schedule your mammogram  Schedule an appointment to have your Mirena IUD replaced during your menses.

## 2018-07-18 NOTE — Progress Notes (Signed)
    Kristen Shepherd 1975-09-17 [id number]        43 y.o.  G0P0 for annual gynecologic exam.  Former patient of Dr. Toney Rakes.  Has a Mirena IUD that was placed in 2012.  Is having regular menses.  Past medical history,surgical history, problem list, medications, allergies, family history and social history were all reviewed and documented as reviewed in the EPIC chart.  ROS:  Performed with pertinent positives and negatives included in the history, assessment and plan.   Additional significant findings : None   Exam: Caryn Bee assistant Vitals:   07/18/18 1459  BP: 134/84  Weight: (!) 306 lb (138.8 kg)  Height: 5\' 8"  (1.727 m)   Body mass index is 46.53 kg/m.  General appearance:  Normal affect, orientation and appearance. Skin: Grossly normal HEENT: Without gross lesions.  No cervical or supraclavicular adenopathy. Thyroid normal.  Lungs:  Clear without wheezing, rales or rhonchi Cardiac: RR, without RMG Abdominal:  Soft, nontender, without masses, guarding, rebound, organomegaly or hernia Breasts:  Examined lying and sitting without masses, retractions, discharge or axillary adenopathy. Pelvic:  Ext, BUS, Vagina: Normal  Cervix: Normal.  Pap smear/HPV.  IUD string not visualized  Uterus: Anteverted, normal size, shape and contour, midline and mobile nontender   Adnexa: Without masses or tenderness    Anus and perineum: Normal   Rectovaginal: Normal sphincter tone without palpated masses or tenderness.    Assessment/Plan:  43 y.o. G0P0 female for annual gynecologic exam.  With regular menses  1. Mirena IUD 2012.  Patient notices she is way overdue to have it removed/replaced.  Strongly recommended she use backup contraception for now.  She wants to have it replaced and we will go ahead and schedule this during her menses. 2. Mammography 2017.  Recommend patient schedule screening mammography.  Breast exam normal today. 3. Pap smear/HPV 2016.  Pap smear/HPV today.  No  history of abnormal Pap smears previously. 4. Health maintenance.  No routine lab work done as patient reports a done elsewhere.  Follow-up for IUD switch out, otherwise 1 year for annual exam   Anastasio Auerbach MD, 3:25 PM 07/18/2018

## 2018-07-20 LAB — PAP IG AND HPV HIGH-RISK: HPV DNA High Risk: NOT DETECTED

## 2018-09-25 DIAGNOSIS — M4722 Other spondylosis with radiculopathy, cervical region: Secondary | ICD-10-CM | POA: Insufficient documentation

## 2018-11-06 DIAGNOSIS — M778 Other enthesopathies, not elsewhere classified: Secondary | ICD-10-CM | POA: Insufficient documentation

## 2018-12-15 ENCOUNTER — Emergency Department
Admission: EM | Admit: 2018-12-15 | Discharge: 2018-12-15 | Disposition: A | Discharge status: Home / Self Care | Payer: 59 | Attending: Emergency Medicine | Admitting: Emergency Medicine

## 2018-12-15 ENCOUNTER — Other Ambulatory Visit: Payer: Self-pay

## 2018-12-15 ENCOUNTER — Encounter: Payer: Self-pay | Admitting: Emergency Medicine

## 2018-12-15 ENCOUNTER — Emergency Department: Payer: 59

## 2018-12-15 DIAGNOSIS — M1712 Unilateral primary osteoarthritis, left knee: Secondary | ICD-10-CM | POA: Insufficient documentation

## 2018-12-15 DIAGNOSIS — M79605 Pain in left leg: Secondary | ICD-10-CM | POA: Diagnosis not present

## 2018-12-15 DIAGNOSIS — M25562 Pain in left knee: Secondary | ICD-10-CM | POA: Diagnosis present

## 2018-12-15 LAB — CBC WITH DIFFERENTIAL/PLATELET
Abs Immature Granulocytes: 0.01 10*3/uL (ref 0.00–0.07)
Basophils Absolute: 0 10*3/uL (ref 0.0–0.1)
Basophils Relative: 0 %
Eosinophils Absolute: 0 10*3/uL (ref 0.0–0.5)
Eosinophils Relative: 0 %
HCT: 40.7 % (ref 36.0–46.0)
Hemoglobin: 13 g/dL (ref 12.0–15.0)
Immature Granulocytes: 0 %
Lymphocytes Relative: 23 %
Lymphs Abs: 1.4 10*3/uL (ref 0.7–4.0)
MCH: 26.8 pg (ref 26.0–34.0)
MCHC: 31.9 g/dL (ref 30.0–36.0)
MCV: 83.9 fL (ref 80.0–100.0)
Monocytes Absolute: 0.3 10*3/uL (ref 0.1–1.0)
Monocytes Relative: 4 %
Neutro Abs: 4.4 10*3/uL (ref 1.7–7.7)
Neutrophils Relative %: 73 %
Platelets: 297 10*3/uL (ref 150–400)
RBC: 4.85 MIL/uL (ref 3.87–5.11)
RDW: 13.8 % (ref 11.5–15.5)
WBC: 6.1 10*3/uL (ref 4.0–10.5)
nRBC: 0 % (ref 0.0–0.2)

## 2018-12-15 LAB — BASIC METABOLIC PANEL
Anion gap: 6 (ref 5–15)
BUN: 13 mg/dL (ref 6–20)
CO2: 24 mmol/L (ref 22–32)
Calcium: 8.6 mg/dL — ABNORMAL LOW (ref 8.9–10.3)
Chloride: 108 mmol/L (ref 98–111)
Creatinine, Ser: 0.67 mg/dL (ref 0.44–1.00)
GFR calc Af Amer: >60 mL/min (ref >60)
GFR calc non Af Amer: >60 mL/min (ref >60)
Glucose, Bld: 133 mg/dL — ABNORMAL HIGH (ref 70–99)
Potassium: 3.4 mmol/L — ABNORMAL LOW (ref 3.5–5.1)
Sodium: 138 mmol/L (ref 135–145)

## 2018-12-15 LAB — POCT PREGNANCY, URINE: Preg Test, Ur: NEGATIVE

## 2018-12-15 MED ORDER — NAPROXEN 375 MG PO TABS: 375.0000 mg | ORAL_TABLET | Freq: Two times a day (BID) | ORAL | 0 refills | Status: AC

## 2018-12-15 MED ORDER — KETOROLAC TROMETHAMINE 30 MG/ML IJ SOLN
15.0000 mg | Freq: Once | INTRAMUSCULAR | Status: AC
  Administered 2018-12-15: 15 mg via INTRAVENOUS
  Filled 2018-12-15: qty 1

## 2018-12-15 NOTE — Discharge Instructions (Signed)
We do believe this pain is caused by arthritis.  We do suggest that you use an Ace wrap at home, and that she stay off it we will give you a couple days off work to see if that helps.  Please use the medication as prescribed.  Return to the emergency room if you have fever, increased pain, redness to the joint, shortness of breath chest pain or other concerns.  We do notice her blood pressure is somewhat elevated here.  It is not clear exactly what that signifies however it does suggest that you do need dates follow-up with your primary care doctor in the next couple days to have your blood pressure rechecked.  If you have any other concerning symptoms please return.

## 2018-12-15 NOTE — ED Notes (Signed)
Pt ambulatory to restroom without difficulty.

## 2018-12-15 NOTE — ED Notes (Signed)
Pt resting in bed with pain at a 7/10. Pt understands that we are awaiting results. No further requests at this time.

## 2018-12-15 NOTE — ED Provider Notes (Addendum)
[facility] Emergency Department Provider Note  ____________________________________________   I have reviewed the triage vital signs and the nursing notes. Where available I have reviewed prior notes and, if possible and indicated, outside hospital notes.   Patient seen and evaluated during the coronavirus epidemic during a time with low staffing  Patient seen for the symptoms described in the history of present illness. She was evaluated in the context of the global COVID-19 pandemic, which necessitated consideration that the patient might be at risk for infection with the SARS-CoV-2 virus that causes COVID-19. Institutional protocols and algorithms that pertain to the evaluation of patients at risk for COVID-19 are in a state of rapid change based on information released by regulatory bodies including the CDC and federal and state organizations. These policies and algorithms were followed during the patient's care in the ED.    HISTORY  Chief Complaint Leg Swelling    HPI Kristen Shepherd is a 43 y.o. female with a history of elevated BMI, and arthritis "everywhere" including neck, elbow, right knee, left knee, shoulder.  Presents today with pain and discomfort to the left knee that began gradually couple days ago.  Patient does walk for her job.  She denies any fever, there is actually no leg swelling.  It was noted that her leg "felt warm" but she states is not actually swollen.  She denies any personal family history of PE or DVT no recent travel, no chest pain or shortness of breath no nausea no vomiting.  She states she is always anxious around providers and her blood pressure always goes up she has whitecoat syndrome.  She denies any trauma to the leg.  When I asked her what hurts is actually just her knee principally that is causing her discomfort.  Both sides of the knee.  There is been no redness or swelling or cellulitic changes no puncture wound, no numbness or  weakness, no back pain no referred pain no radicular pathology no dysuria no incontinence of bowel or bladder.     Past Medical History:  Diagnosis Date  . DUB (dysfunctional uterine bleeding) 6.22.12  . Endometrial polyp 6.22.12  . IUD 11/24/10   MIRENA INSERTED 11/24/10    Patient Active Problem List   Diagnosis Date Noted  . Weight gain 12/06/2011    Past Surgical History:  Procedure Laterality Date  . HYSTEROSCOPY  6.22.12   polyp and dub  . INTRAUTERINE DEVICE INSERTION  11/24/10   MIRENA    Prior to Admission medications   Medication Sig Start Date End Date Taking? Authorizing Provider  cyclobenzaprine (FLEXERIL) 5 MG tablet Take 5 mg by mouth 3 (three) times daily as needed for muscle spasms.    [provider]  hydrochlorothiazide (HYDRODIURIL) 25 MG tablet Take 25 mg by mouth daily.    [provider]  ibuprofen (ADVIL,MOTRIN) 800 MG tablet Take 1 tablet (800 mg total) by mouth every 8 (eight) hours as needed. 01/04/15   Terrance Mass, MD  levonorgestrel (MIRENA) 20 MCG/24HR IUD 1 each by Intrauterine route once. INSERTED 11/24/10     [provider]  Multiple Vitamin (MULTIVITAMIN PO) Take by mouth.      [provider]    Allergies Patient has no known allergies.  Family History  Problem Relation Age of Onset  . Hypertension Mother   . Breast cancer Maternal Aunt 62    Social History Social History   Tobacco Use  . Smoking status: Never Smoker  .  Smokeless tobacco: Never Used  Substance Use Topics  . Alcohol use: No    Alcohol/week: 0.0 standard drinks  . Drug use: No    Review of Systems Constitutional: No fever/chills Eyes: No visual changes. ENT: No sore throat. No stiff neck no neck pain Cardiovascular: Denies chest pain. Respiratory: Denies shortness of breath. Gastrointestinal:   no vomiting.  No diarrhea.  No constipation. Genitourinary: Negative for dysuria. Musculoskeletal: Negative lower extremity  swelling Skin: Negative for rash. Neurological: Negative for severe headaches, focal weakness or numbness.   ____________________________________________   PHYSICAL EXAM:  VITAL SIGNS: ED Triage Vitals  Enc Vitals Group     BP 12/15/18 1150 (!) 206/121     Pulse Rate 12/15/18 1150 87     Resp 12/15/18 1150 17     Temp 12/15/18 1150 99.3 F (37.4 C)     Temp Source 12/15/18 1150 Oral     SpO2 12/15/18 1150 98 %     Weight 12/15/18 1147 295 lb (133.8 kg)     Height 12/15/18 1147 5\' 7"  (1.702 m)     Head Circumference --      Peak Flow --      Pain Score 12/15/18 1147 9     Pain Loc --      Pain Edu? --      Excl. in [place]? --     Constitutional: Alert and oriented. Well appearing and in no acute distress. Eyes: Conjunctivae are normal Head: Atraumatic HEENT: No congestion/rhinnorhea. Mucous membranes are moist.  Oropharynx non-erythematous Neck:   Nontender with no meningismus, no masses, no stridor Cardiovascular: Normal rate, regular rhythm. Grossly normal heart sounds.  Good peripheral circulation. Respiratory: Normal respiratory effort.  No retractions. Lungs CTAB. Abdominal: Soft and nontender. No distention. No guarding no rebound Back:  There is no focal tenderness or step off.  there is no midline tenderness there are no lesions noted.  Musculoskeletal: Patient has tenderness palpation of the left knee, full range of motion is noted.  Is not warm or hot or red.  I do not notice any significant swelling but BMI does limit some extent that evaluation.  He has she has strong distal pulses good cap refill, she has no proximal lymphadenopathy, female nurse chaperone is present for exam.  There is no calf pain negative Homans sign.  No upper extremity tenderness. No joint effusions, no DVT signs strong distal pulses no edema Neurologic:  Normal speech and language. No gross focal neurologic deficits are appreciated.  Skin:  Skin is warm, dry and intact. No rash  noted. Psychiatric: Mood and affect are normal. Speech and behavior are normal.  ____________________________________________   LABS (all labs ordered are listed, but only abnormal results are displayed)  Labs Reviewed  CBC WITH DIFFERENTIAL/PLATELET  BASIC METABOLIC PANEL  POC URINE PREG, ED    Pertinent labs  results that were available during my care of the patient were reviewed by me and considered in my medical decision making (see chart for details). ____________________________________________  EKG  I personally interpreted any EKGs ordered by me or triage  ____________________________________________  RADIOLOGY  Pertinent labs & imaging results that were available during my care of the patient were reviewed by me and considered in my medical decision making (see chart for details). If possible, patient and/or family made aware of any abnormal findings.  No results found. ____________________________________________    PROCEDURES  Procedure(s) performed: None  Procedures  Critical Care performed: None  ____________________________________________  INITIAL IMPRESSION / ASSESSMENT AND PLAN / ED COURSE  Pertinent labs & imaging results that were available during my care of the patient were reviewed by me and considered in my medical decision making (see chart for details).   Patient here for evaluation of knee pain.  She does appear slightly uncomfortable.  We will give her pain medication.  The joint is not red or hot and she has no systemic symptoms of infection.  Low suspicion for infected joint.  Low suspicion for DVT but will obtain an ultrasound as she does state the pain somewhat radiates in both directions although is not really elicitable in both directions.  She does not have any evidence of acute arterial pathology this could be radicular but I have low suspicion given that she has tenderness to palpation on both sides of the knee itself.  Patient has a  very long and very strong history of arthritis both in herself and in multiple family members.  This is most likely arthritic process we will obtain an x-ray, low suspicion for fracture, no trauma, nothing to suggest pathologic fracture being likely but again we will image.  Her blood pressure is elevated here.  It was reportedly closer to normal at the urgent care.  States she is very nervous about being around doctors and she was told something at the [employer] that made her anxious about the possibility of a blood clot.  We will give her pain medications, have tried to reassure her and we will reassess.  No chest pain no shortness of breath nothing to suggest decompensated hypertensive urgency.  However we will check a BUN and creatinine as a baseline.  Retaken with a cuff that were too small.  ----------------------------------------- 1:58 PM on 12/15/2018 -----------------------------------------  Patient states that her pain is much better and she would like to go home.  We did offer her crutches or so this stay off the joint and she declines.  I do not think a tapping of the joint is indicated.  I think I am more likely to cause an infection then to find one.  There are no infectious markers noted.  Patient is very familiar with this pain she states this is exactly like her chronic knee pain on the right and she has had several bouts of this pain before on the left.  No known gouty arthritis.  She has been followed by rheumatology and orthopedic for these problems will have her follow-up closely and repeat blood pressure has been indicated.  I also suggested to the patient and the most tactful way that a reduction in her BMI would likely be of benefit given what we find on x-ray.  She seemed receptive to this information I did give her some counseling about it.   ____________________________________________   FINAL CLINICAL IMPRESSION(S) / ED DIAGNOSES  Final diagnoses:  None      This chart  was dictated using voice recognition software.  Despite best efforts to proofread,  errors can occur which can change meaning.      Schuyler Amor, MD 12/15/18 1231    Schuyler Amor, MD 12/15/18 1359

## 2018-12-15 NOTE — ED Notes (Signed)
Patient transported to X-ray 

## 2018-12-15 NOTE — ED Triage Notes (Signed)
Pt c/o left leg pain with swelling , states it started around the knee and now swollen into the thigh and calf was sent from next care. States sx started on Friday. Denies injury

## 2018-12-17 DIAGNOSIS — M1712 Unilateral primary osteoarthritis, left knee: Secondary | ICD-10-CM | POA: Insufficient documentation

## 2019-02-03 ENCOUNTER — Other Ambulatory Visit: Payer: Self-pay

## 2019-02-05 ENCOUNTER — Ambulatory Visit (INDEPENDENT_AMBULATORY_CARE_PROVIDER_SITE_OTHER): Payer: 59 | Admitting: Gynecology

## 2019-02-05 ENCOUNTER — Other Ambulatory Visit: Payer: Self-pay

## 2019-02-05 ENCOUNTER — Encounter: Payer: Self-pay | Admitting: Gynecology

## 2019-02-05 VITALS — BP 124/82

## 2019-02-05 DIAGNOSIS — Z30433 Encounter for removal and reinsertion of intrauterine contraceptive device: Secondary | ICD-10-CM

## 2019-02-05 HISTORY — PX: INTRAUTERINE DEVICE INSERTION: SHX323

## 2019-02-05 NOTE — Progress Notes (Signed)
    EDIT JIMINIAN 10-06-1975 [id number]        43 y.o.  G0P0  presents for Mirena IUD replacement. She has read through the booklet, has no contraindications and signed the consent form. She currently is on a normal menses.  I reviewed the removal and insertional process with her as well as the risks to include infection, either immediate or long-term, uterine perforation or migration requiring surgery to remove, other complications such as pain, hormonal side effects and possibility of failure with subsequent pregnancy.   Exam with Caryn Bee assistant Vitals:   02/05/19 1211  BP: 124/82    Pelvic: External BUS vagina normal. Cervix normal IUD string not visible. Uterus anteverted normal size shape contour midline mobile nontender. Adnexa without masses or tenderness.  Procedure: The cervix was visualized with a speculum and using the small graspers which were introduced into the endocervical canal the Mirena IUD string was blindly grasped, the IUD removed, shown to the patient and discarded.  The cervix was then cleansed with Betadine, anterior lip grasped with a single-tooth tenaculum, the uterus was sounded and a Mirena IUD was placed according to manufacturer's recommendations without difficulty. The strings were trimmed. The patient tolerated well and will follow up in one month for a postinsertional check.  Lot number:  [id number]    Anastasio Auerbach MD, 12:55 PM 02/05/2019

## 2019-02-07 ENCOUNTER — Encounter: Payer: Self-pay | Admitting: Gynecology

## 2019-02-11 ENCOUNTER — Encounter: Payer: Self-pay | Admitting: Gynecology

## 2019-03-12 ENCOUNTER — Encounter: Payer: Self-pay | Admitting: Gynecology

## 2019-03-12 ENCOUNTER — Other Ambulatory Visit: Payer: Self-pay

## 2019-03-12 ENCOUNTER — Ambulatory Visit (INDEPENDENT_AMBULATORY_CARE_PROVIDER_SITE_OTHER): Payer: 59 | Admitting: Gynecology

## 2019-03-12 VITALS — BP 124/76

## 2019-03-12 DIAGNOSIS — N938 Other specified abnormal uterine and vaginal bleeding: Secondary | ICD-10-CM

## 2019-03-12 DIAGNOSIS — Z30431 Encounter for routine checking of intrauterine contraceptive device: Secondary | ICD-10-CM | POA: Diagnosis not present

## 2019-03-12 MED ORDER — MEGESTROL ACETATE 20 MG PO TABS: 20.0000 mg | ORAL_TABLET | Freq: Every day | ORAL | 0 refills | Status: DC

## 2019-03-12 NOTE — Progress Notes (Signed)
    Kristen Shepherd 1976/03/24 [id number]        43 y.o.  G0P0 presents for IUD follow-up.  Had Mirena IUD replaced 02/05/2019.  Has had spotting on and off.  Occasional heavier flow.  Past medical history,surgical history, problem list, medications, allergies, family history and social history were all reviewed and documented in the EPIC chart.  Directed ROS with pertinent positives and negatives documented in the history of present illness/assessment and plan.  Exam: Caryn Bee assistant Vitals:   03/12/19 1506  BP: 124/76   General appearance:  Normal Abdomen soft nontender without masses guarding rebound Pelvic external BUS vagina with light staining.  Cervix normal.  IUD string visualized and appropriate length.  Uterus grossly normal midline mobile nontender.  Adnexa without masses or tenderness.  Assessment/Plan:  43 y.o. G0P0 with continued spotting on and off with Mirena IUD.  Recommend progesterone Megace 20 mg daily x14 days to see if we cannot thin the endometrium.  Follow-up if irregular bleeding continues.  Follow-up beginning of next year for annual exam when due.    Anastasio Auerbach MD, 3:21 PM 03/12/2019

## 2019-03-12 NOTE — Patient Instructions (Signed)
Follow-up if irregular bleeding continues  Follow-up beginning of next year for annual exam when due

## 2019-03-31 ENCOUNTER — Telehealth: Payer: Self-pay | Admitting: *Deleted

## 2019-03-31 DIAGNOSIS — N938 Other specified abnormal uterine and vaginal bleeding: Secondary | ICD-10-CM

## 2019-03-31 NOTE — Telephone Encounter (Signed)
Pt aware of recommendations will schedule U/S

## 2019-03-31 NOTE — Telephone Encounter (Signed)
Recommend ultrasound appointment for IUD placement

## 2019-03-31 NOTE — Telephone Encounter (Signed)
Vaginal Bleeding since IUD insert, associated with cramping, changes pad every 2 hrs, no vaginal odor. Will route to Dr. Denman George for recommendations as far as office visit or medication.

## 2019-04-02 ENCOUNTER — Other Ambulatory Visit: Payer: Self-pay

## 2019-04-03 ENCOUNTER — Other Ambulatory Visit: Payer: Self-pay

## 2019-04-03 ENCOUNTER — Ambulatory Visit: Payer: 59 | Admitting: Gynecology

## 2019-04-03 ENCOUNTER — Encounter: Payer: Self-pay | Admitting: Gynecology

## 2019-04-03 ENCOUNTER — Ambulatory Visit (INDEPENDENT_AMBULATORY_CARE_PROVIDER_SITE_OTHER): Payer: 59

## 2019-04-03 VITALS — BP 134/84

## 2019-04-03 DIAGNOSIS — N926 Irregular menstruation, unspecified: Secondary | ICD-10-CM

## 2019-04-03 DIAGNOSIS — Z30431 Encounter for routine checking of intrauterine contraceptive device: Secondary | ICD-10-CM | POA: Diagnosis not present

## 2019-04-03 DIAGNOSIS — N938 Other specified abnormal uterine and vaginal bleeding: Secondary | ICD-10-CM | POA: Diagnosis not present

## 2019-04-03 DIAGNOSIS — Z975 Presence of (intrauterine) contraceptive device: Secondary | ICD-10-CM | POA: Diagnosis not present

## 2019-04-03 MED ORDER — NORETHIN ACE-ETH ESTRAD-FE 1-20 MG-MCG PO TABS: 1.0000 | ORAL_TABLET | Freq: Every day | ORAL | 0 refills | Status: DC

## 2019-04-03 NOTE — Progress Notes (Signed)
    Kristen Shepherd 08-02-1975 [id number]        43 y.o.  G0P0 presents for ultrasound.  Had Mirena IUD replaced 01/2019.  Has bled on and off since.  Anywhere from spotting to passage of clots.  Had normal follow-up IUD check end of October.  Short course of Megace did not stop the bleeding.  Past medical history,surgical history, problem list, medications, allergies, family history and social history were all reviewed and documented in the EPIC chart.  Directed ROS with pertinent positives and negatives documented in the history of present illness/assessment and plan.  Exam: Vitals:   04/03/19 1408  BP: 134/84   General appearance:  Normal  Ultrasound transvaginal shows uterus normal size and echotexture.  2 small myomas noted at 1.27 cm and 0.85 cm.  Endometrial echo 2.63 mm.  Right and left ovaries visualized and normal.  IUD visualized in normal position within the endometrial cavity.  Cul-de-sac negative.  Assessment/Plan:  43 y.o. G0P0 with continued irregular bleeding since IUD placement.  Follow-up exam was normal.  Ultrasound now shows IUD in the proper location with a thin endometrial echo.  Short trial of Megace unsuccessful.  Options for management reviewed.  I recommended low-dose 1/20 equivalent birth control pills x2 to 3 months to see if this does not regulate her cycle.  Risks of oral contraceptives reviewed to include thrombosis such as stroke heart attack DVT.  Never smoked and not being followed for medical issues.  Has used BCPs in the past without difficulty.  She will go ahead and start these and follow-up if irregular bleeding continues.    Anastasio Auerbach MD, 2:27 PM 04/03/2019

## 2019-04-03 NOTE — Patient Instructions (Signed)
Start on the birth control pills as we discussed for the next 2 to 3 months.  Follow-up if irregular bleeding continues

## 2019-04-23 ENCOUNTER — Other Ambulatory Visit: Payer: Self-pay | Admitting: Gynecology

## 2019-07-18 ENCOUNTER — Other Ambulatory Visit: Payer: Self-pay

## 2019-07-21 ENCOUNTER — Other Ambulatory Visit: Payer: Self-pay

## 2019-07-21 ENCOUNTER — Encounter: Payer: 59 | Admitting: Gynecology

## 2019-07-21 ENCOUNTER — Ambulatory Visit: Payer: 59 | Admitting: Obstetrics and Gynecology

## 2019-07-21 ENCOUNTER — Encounter: Payer: Self-pay | Admitting: Obstetrics and Gynecology

## 2019-07-21 VITALS — BP 124/84 | Ht 68.0 in | Wt 285.0 lb

## 2019-07-21 DIAGNOSIS — Z13220 Encounter for screening for lipoid disorders: Secondary | ICD-10-CM | POA: Diagnosis not present

## 2019-07-21 DIAGNOSIS — Z30431 Encounter for routine checking of intrauterine contraceptive device: Secondary | ICD-10-CM

## 2019-07-21 DIAGNOSIS — N926 Irregular menstruation, unspecified: Secondary | ICD-10-CM | POA: Diagnosis not present

## 2019-07-21 DIAGNOSIS — Z01419 Encounter for gynecological examination (general) (routine) without abnormal findings: Secondary | ICD-10-CM

## 2019-07-21 DIAGNOSIS — N938 Other specified abnormal uterine and vaginal bleeding: Secondary | ICD-10-CM

## 2019-07-21 MED ORDER — TRANEXAMIC ACID 650 MG PO TABS: ORAL_TABLET | ORAL | 2 refills | Status: DC

## 2019-07-21 NOTE — Progress Notes (Signed)
Kristen Shepherd [birth year] [id number]  SUBJECTIVE:  44 y.o. G0P0 female for annual routine gynecologic exam. Unfortunately since her brain IUD was placed 01/2019, she has experienced unusually heavy periods.  She says that she had very minimal to no period when she had her first IUD placed even up until it was removed in 2020, even though it was overdue to be removed.  He has experienced 10 days of heavy bleeding each month, not to require a pad change every 1-2 hours, which was like how her periods were prior to when she had her first Mirena IUD.  She then has light flow for about another 4 days, 7 to 10 days of no bleeding, but then returned to heavy bleeding again.  Her first IUD really regulated her periods well.  Due to the abnormal bleeding, she had an evaluation with ultrasound back on 04/03/2019.  There were no abnormalities and the IUD appeared to be in up normal location, so she was trialed on oral contraceptive pills which she took regularly for 3 months, but she noticed no improvement in the bleeding.    Current Outpatient Medications  Medication Sig Dispense Refill  . ibuprofen (ADVIL,MOTRIN) 800 MG tablet Take 1 tablet (800 mg total) by mouth every 8 (eight) hours as needed. 30 tablet 8  . levonorgestrel (MIRENA) 20 MCG/24HR IUD 1 each by Intrauterine route once. INSERTED 11/24/10     . Multiple Vitamin (MULTIVITAMIN PO) Take by mouth.      Lenda Kelp FE 1/20 1-20 MG-MCG tablet TAKE 1 TABLET BY MOUTH EVERY DAY (Patient not taking: Reported on 07/21/2019) 28 tablet 2  . megestrol (MEGACE) 20 MG tablet Take 1 tablet (20 mg total) by mouth daily. (Patient not taking: Reported on 04/03/2019) 14 tablet 0   No current facility-administered medications for this visit.   Allergies: Patient has no known allergies.  No LMP recorded. (Menstrual status: IUD).  Past medical history,surgical history, problem list, medications, allergies, family history and social history were all reviewed and  documented as reviewed in the EPIC chart.  ROS:  Feeling well. No dyspnea or chest pain on exertion.  No abdominal pain, change in bowel habits, black or bloody stools.  No urinary tract symptoms. GYN ROS: + abnormal bleeding, pelvic pain or discharge, no breast pain or new or enlarging lumps on self exam. No neurological complaints.   OBJECTIVE:  BP 124/84 (Cuff Size: Large)   Ht 5\' 8"  (1.727 m)   Wt 285 lb (129.3 kg)   BMI 43.33 kg/m  The patient appears well, alert, oriented x 3, in no distress. ENT normal.  Neck supple. No cervical or supraclavicular adenopathy or thyromegaly.  Lungs are clear, good air entry, no wheezes, rhonchi or rales. S1 and S2 normal, no murmurs, regular rate and rhythm.  Abdomen soft without tenderness, guarding, mass or organomegaly.  Neurological is normal, no focal findings.  BREAST EXAM: breasts appear normal, no suspicious masses, no skin or nipple changes or axillary nodes  PELVIC EXAM: VULVA: normal appearing vulva with no masses, tenderness or lesions, VAGINA: 10 mL of dark menses in vaginal vault, normal appearing vagina with normal color and discharge, no lesions, CERVIX: normal appearing cervix without discharge or lesions, no active bleeding, IUD strings protrude about 1 cm from the external cervical os, UTERUS: Exam limited by body habitus, ADNEXA: Exam limited by body habitus, but nontender  Endometrial biopsy procedure note The cervix was cleansed with a Betadine swab.  The endometrial Pipelle sampler was  easily introduced into the endometrial cavity and sound length measurement was taken 9 cm.  The plunger was withdrawn causing suction in the Pipelle and the device was moved about the cavity for about 10 sec.  The Pipelle was removed then the sample was emptied in a specimen cup.  The specimen contents were collected and sent to pathology for analysis.  The IUD was not dislodged during the course of the procedure.  The patient tolerated the procedure  well without any complication.  Chaperone: Caryn Bee present during the examination and procedure  ASSESSMENT:  44 y.o. G0P0 here for annual gynecologic exam  PLAN:   1. Abnormal uterine bleeding with menorrhagia.  Will check TSH and CBC along with other routine health maintenance screening labs.  Discussed that there is possibility of endometrial polyps, hyperplasia, more rarely cancer in the uterus that is causing this irregular, prolonged, and heavy bleeding despite the use of the Mirena IUD.  Endometrial biopsy was performed today and we will have the results back from that.  I will start her on Lysteda to begin with her next menstrual period, and we reviewed the mechanism of action and the risk factors and ensure that she does not have a prior history of thrombotic disease or thrombophilia herself. 2. Pap smear/HPV 06/2018.  Current screening guidelines calling for the 5-year Pap smear cytology and HPV screen interval. 3. Contraception. Mirena IUD replaced 01/2019, appeared in normal position on 03/2019 ultrasound performed due to AUB.  She was prescribed OCPs for the BTB which did not help.  Work up as above. 5. Mammogram 2017.  Normal breast exam.  Encouraged breast self awareness and notify us of any concerning changes. Encouraged to schedule annual screening mammogram. 6. Health maintenance.  Routine screening blood work (lipids, CBC, CMP, TSH) is ordered and she will proceed to lab.  Return if bleeding symptoms do not improve in the next few months, or annually if no concerns.  Joseph Pierini MD  07/21/19

## 2019-07-21 NOTE — Patient Instructions (Addendum)
Use the Lysteda as prescribed, start it when having menstrual period, take it 3 times a day for 5 days is a total of 30 tablets per menstrual period We will let you know the results of your endometrial biopsy when available. Let us know if the bleeding is not improving on that medication and we will need to have you back for discussion of the next plan moving forward.

## 2019-07-22 LAB — LIPID PANEL
Cholesterol: 191 mg/dL (ref <200)
HDL: 40 mg/dL — ABNORMAL LOW (ref >=50)
LDL Cholesterol (Calc): 134 mg/dL (calc) — ABNORMAL HIGH
Non-HDL Cholesterol (Calc): 151 mg/dL (calc) — ABNORMAL HIGH (ref <130)
Total CHOL/HDL Ratio: 4.8 (calc) (ref <5.0)
Triglycerides: 71 mg/dL (ref <150)

## 2019-07-22 LAB — CBC
HCT: 41.5 % (ref 35.0–45.0)
Hemoglobin: 13.3 g/dL (ref 11.7–15.5)
MCH: 26.8 pg — ABNORMAL LOW (ref 27.0–33.0)
MCHC: 32 g/dL (ref 32.0–36.0)
MCV: 83.7 fL (ref 80.0–100.0)
MPV: 10.5 fL (ref 7.5–12.5)
Platelets: 299 10*3/uL (ref 140–400)
RBC: 4.96 10*6/uL (ref 3.80–5.10)
RDW: 13.6 % (ref 11.0–15.0)
WBC: 5.8 10*3/uL (ref 3.8–10.8)

## 2019-07-22 LAB — COMPREHENSIVE METABOLIC PANEL
AG Ratio: 1.5 (calc) (ref 1.0–2.5)
ALT: 12 U/L (ref 6–29)
AST: 13 U/L (ref 10–30)
Albumin: 4.3 g/dL (ref 3.6–5.1)
Alkaline phosphatase (APISO): 56 U/L (ref 31–125)
BUN: 17 mg/dL (ref 7–25)
CO2: 25 mmol/L (ref 20–32)
Calcium: 9.4 mg/dL (ref 8.6–10.2)
Chloride: 104 mmol/L (ref 98–110)
Creat: 0.74 mg/dL (ref 0.50–1.10)
Globulin: 2.9 g/dL (calc) (ref 1.9–3.7)
Glucose, Bld: 84 mg/dL (ref 65–99)
Potassium: 3.8 mmol/L (ref 3.5–5.3)
Sodium: 139 mmol/L (ref 135–146)
Total Bilirubin: 0.8 mg/dL (ref 0.2–1.2)
Total Protein: 7.2 g/dL (ref 6.1–8.1)

## 2019-07-22 LAB — TSH: TSH: 3.24 mIU/L

## 2019-07-23 LAB — PATHOLOGY REPORT

## 2019-07-23 LAB — TISSUE SPECIMEN

## 2019-07-28 ENCOUNTER — Telehealth: Payer: Self-pay

## 2019-07-28 NOTE — Telephone Encounter (Signed)
A result note message Dr. Delilah Shan sent the patient returned unseen. I called her and read it to her.  "Kristen Shepherd, Good news, the endometrial biopsy showed benign tissue. Your thyroid screen, blood counts, and metabolic panel are all normal. The LDL/bad cholesterol level is slightly elevated, and the HDL/good cholesterol level is slightly low (want this value to be higher). Recommend discussing with your primary doctor if you need treatment for the higher cholesterol level. Dr. Delilah Shan"

## 2019-11-17 ENCOUNTER — Encounter: Payer: Self-pay | Admitting: Obstetrics and Gynecology

## 2019-11-18 NOTE — Telephone Encounter (Signed)
Please make an appointment

## 2019-11-28 ENCOUNTER — Other Ambulatory Visit: Payer: Self-pay

## 2019-11-28 ENCOUNTER — Encounter: Payer: Self-pay | Admitting: Obstetrics and Gynecology

## 2019-11-28 ENCOUNTER — Ambulatory Visit: Payer: 59 | Admitting: Obstetrics and Gynecology

## 2019-11-28 VITALS — BP 124/78

## 2019-11-28 DIAGNOSIS — N926 Irregular menstruation, unspecified: Secondary | ICD-10-CM | POA: Diagnosis not present

## 2019-11-28 DIAGNOSIS — N939 Abnormal uterine and vaginal bleeding, unspecified: Secondary | ICD-10-CM

## 2019-11-28 DIAGNOSIS — N938 Other specified abnormal uterine and vaginal bleeding: Secondary | ICD-10-CM

## 2019-11-28 MED ORDER — MEGESTROL ACETATE 40 MG PO TABS: 40.0000 mg | ORAL_TABLET | Freq: Two times a day (BID) | ORAL | 1 refills | Status: DC

## 2019-11-28 NOTE — Progress Notes (Signed)
Kristen Shepherd 1976/04/29 [id number]  SUBJECTIVE:  44 y.o. G0P0 female presents for recommendations for refractory abnormal uterine bleeding and menorrhagia.  Her husband is present for the discussion today.  She does have Mirena IUD in place, placed 01/2019.  Please see the previous note for details.  Her last IUD did a better job of controlling her irregular bleeding, but she says today that it only initially works and in the last few years of when she had it she started to resort back to the prolonged and heavier bleeding that she has been experiencing once the placement of this new IUD.  It never really got better.  Tried 3 months of oral contraceptive pills in addition to the IUD but said this did not improve her bleeding profile.  We tried her on a few courses of Lysteda after her last visit in March but she said this made the bleeding slightly lighter but still continued for about 15 days of menstrual flow.  She also gets terrible cramping during this time as well.  She has bleeding with 15 days of flow or so every month followed by perhaps 1 week without bleeding and then she continues into another prolonged period.  She passes large clots, and Lysteda helped a little bit with this.  She is ready for the next steps in management given the ongoing prolonged menstrual bleeding each month.   Current Outpatient Medications  Medication Sig Dispense Refill  . ibuprofen (ADVIL,MOTRIN) 800 MG tablet Take 1 tablet (800 mg total) by mouth every 8 (eight) hours as needed. 30 tablet 8  . levonorgestrel (MIRENA) 20 MCG/24HR IUD 1 each by Intrauterine route once. INSERTED 11/24/10     . Multiple Vitamin (MULTIVITAMIN PO) Take by mouth.      . megestrol (MEGACE) 20 MG tablet Take 1 tablet (20 mg total) by mouth daily. (Patient not taking: Reported on 04/03/2019) 14 tablet 0   No current facility-administered medications for this visit.   Allergies: Patient has no known allergies.  No LMP recorded.  (Menstrual status: IUD).  Past medical history,surgical history, problem list, medications, allergies, family history and social history were all reviewed and documented as reviewed in the EPIC chart.  ROS:  Feeling well. No dyspnea or chest pain on exertion.  No abdominal pain, change in bowel habits, black or bloody stools.  No urinary tract symptoms. GYN ROS: As described above in HPI   OBJECTIVE:  BP 124/78 (Cuff Size: Large)  The patient appears well, alert, oriented x 3, in no distress. Abdomen: Soft, nontender, nondistended.  No scars appreciated. PELVIC EXAM: Deferred as this was performed at her recent visit   ASSESSMENT:  44 y.o. G0P0 here for recommendations regarding management of abnormal uterine bleeding with menorrhagia and prolonged menstruation, refractory to conservative treatment  PLAN:  Work-up so far has included a normal TSH, normal endometrial biopsy, and unremarkable pelvic ultrasound last fall.  She has small fibroids present in the uterus.  Uterus sounded to 9 cm on her recent biopsy. She has tried conservative treatments to include oral contraceptive pills, Mirena IUD, tranexamic acid, and now we are going to try her on Megace 40 mg BID x 2 weeks when on period.  Potential side effects reviewed.  She did take this before last year (lower 20 mg BID dose) and it did not stop the bleeding. My concern is that she could have adenomyosis. Since she has been refractory to some of the more effective hormonal and nonhormonal treatments  for heavy periods we discussed next level involves procedural options.  We briefly discussed endometrial ablation as something she could reasonably try but given her issues with terrible dysmenorrhea and the prolonged duration of her periods, she may not have sufficient relief with this procedure, however, we would be willing to try this as a less invasive option first.  Alternatively, she could have a hysterectomy.  She is interested in  definitive treatment given the ongoing menstrual bleeding problem that she has been having. I propose a robotic assisted total laparoscopic hysterectomy, bilateral salpingectomy, and cystoscopy.  She would retain her ovaries for natural hormonal function and benefits.  I discussed the hysterectomy procedure in detail with the patient including the length of time anticipated to perform the procedure, the expectation for overnight stay in the hospital and subsequent discharge the following day, and postoperative convalescence and recovery expectations.  I discussed that she will need to plan to be off of work for potentially up to 6 weeks but may be able go back sooner a limited capacity keeping in mind lifting restrictions of 10 to 20 lb and no repetitive squatting, bending, or straining maneuvers (currently works a job with minimal physical activity).  Pelvic rest restriction was also reviewed.  The patient is aware that hysterectomy is considered to be a major surgical procedure and would preclude her from childbearing, she is absolutely certain that she does not wish to carry a pregnancy now or in the future.  There are risks of infection, bleeding, possibility of needing a blood transfusion, injury to bowel, bladder, major pelvic vessels, ureter, nerves from positioning and or skin irritation, and deep venous thrombosis. The risk of inadvertent injury to internal organs either immediately recognized or delay recognized necessitating major exploratory reparative surgeries and future reparative surgeries including bowel resection, ostomy formation, bladder repair, ureteral damage repair was discussed with her.  General anesthesia also has risks including myocardial infarction, stroke, and death.  Common scenarios for complications from surgery were reviewed with the patient including focus on bladder and/or ureteral injuries.  Incisional complications to include opening and draining of incisions and closure by  secondary intention, dehiscence, and hernia formation were reviewed. Generally the complication rate is less than 1%.  Conversion to laparotomy may be deemed necessary at the time of the procedure, which if performed would result in longer hospital stay, and more postoperative pain and healing time.  The patient would like to proceed. The patient is provided with a hysterectomy information pamphlet and we will have staff reach out to her to schedule the procedure and preoperative consultation visit.    Joseph Pierini MD 11/28/19

## 2019-12-01 ENCOUNTER — Telehealth: Payer: Self-pay

## 2019-12-01 NOTE — Telephone Encounter (Signed)
I called patient to discuss scheduling surgery. We reviewed her insurance benefits and her estimated [facility] surgery prepayment amount due by one week prior to surgery.  I discussed with her that I must wait on the September schedule to look for a possible date for her.  She is fine with that and I will call her when I have secured the Sept schedule.

## 2019-12-11 ENCOUNTER — Other Ambulatory Visit: Payer: Self-pay

## 2019-12-11 ENCOUNTER — Telehealth: Payer: Self-pay

## 2019-12-11 DIAGNOSIS — E349 Endocrine disorder, unspecified: Secondary | ICD-10-CM

## 2019-12-11 DIAGNOSIS — R7989 Other specified abnormal findings of blood chemistry: Secondary | ICD-10-CM

## 2019-12-15 ENCOUNTER — Telehealth: Payer: Self-pay

## 2019-12-15 NOTE — Telephone Encounter (Signed)
Encounter opened in error

## 2019-12-15 NOTE — Telephone Encounter (Signed)
I spoke with patient about scheduling surgery.  We discussed her insurance benefits and her estimated surgery prepayment amount due by one week prior to surgery to [place].  I will mail her a financial letter as well.  We discussed need for Covid testing 4 days prior and quarantine protocol following the test. Appt was scheduled accordingly and handout will be mailed to her with her date/time on it.  I scheduled her surgery for 01/21/20/at 8:15am at [facility] and pre op visit with Dr. Delilah Shan is scheduled for 8.25.21 at 3:00pm.  Packet mailed to her.

## 2019-12-16 ENCOUNTER — Encounter: Payer: Self-pay | Admitting: Gynecology

## 2019-12-17 ENCOUNTER — Encounter: Payer: Self-pay | Admitting: Obstetrics and Gynecology

## 2019-12-18 ENCOUNTER — Other Ambulatory Visit: Payer: Self-pay | Admitting: Obstetrics and Gynecology

## 2019-12-30 DIAGNOSIS — Z0289 Encounter for other administrative examinations: Secondary | ICD-10-CM

## 2020-01-01 ENCOUNTER — Encounter: Payer: Self-pay | Admitting: Gynecology

## 2020-01-02 ENCOUNTER — Other Ambulatory Visit: Payer: Self-pay | Admitting: Obstetrics and Gynecology

## 2020-01-13 ENCOUNTER — Encounter: Payer: Self-pay | Admitting: Gynecology

## 2020-01-13 ENCOUNTER — Telehealth: Payer: Self-pay

## 2020-01-13 NOTE — Telephone Encounter (Signed)
Patient called. Wants to change her pre op appt to virtual. Ok with Dr. Allena Napoleon per surgery order slip. Appt desk will call her to change.

## 2020-01-14 ENCOUNTER — Telehealth (INDEPENDENT_AMBULATORY_CARE_PROVIDER_SITE_OTHER): Payer: 59 | Admitting: Obstetrics and Gynecology

## 2020-01-14 ENCOUNTER — Encounter: Payer: Self-pay | Admitting: Obstetrics and Gynecology

## 2020-01-14 ENCOUNTER — Other Ambulatory Visit: Payer: Self-pay

## 2020-01-14 DIAGNOSIS — N939 Abnormal uterine and vaginal bleeding, unspecified: Secondary | ICD-10-CM

## 2020-01-14 DIAGNOSIS — N938 Other specified abnormal uterine and vaginal bleeding: Secondary | ICD-10-CM

## 2020-01-14 DIAGNOSIS — N946 Dysmenorrhea, unspecified: Secondary | ICD-10-CM

## 2020-01-14 DIAGNOSIS — N926 Irregular menstruation, unspecified: Secondary | ICD-10-CM | POA: Diagnosis not present

## 2020-01-14 NOTE — Progress Notes (Signed)
Kristen Shepherd Aug 05, 1975 [id number]  SUBJECTIVE:  44 y.o. G0P0 female attending a virtual video encounter for preoperative visit prior to a scheduled robotic assisted total laparoscopic hysterectomy, bilateral salpingectomy, and cystoscopy on 01/21/2020 for refractory abnormal uterine bleeding.  Identity is confirmed with name and date of birth.   Please see the note from 11/28/2019 for details.  She does continue to have sporadic heavy bleeding, a little shorter in duration of these episodes since last visit, but still having quite heavy bleeding lasting 3 to 4 days occurring as frequently as every few days.  Still having terrible cramping on her menstrual period.  Current Outpatient Medications  Medication Sig Dispense Refill  . levonorgestrel (MIRENA) 20 MCG/24HR IUD 1 each by Intrauterine route once.     . Multiple Vitamin (MULTIVITAMIN WITH MINERALS) TABS tablet Take 1 tablet by mouth daily. One-A-Day for Women    . megestrol (MEGACE) 40 MG tablet Take 1 tablet (40 mg total) by mouth 2 (two) times daily for 15 days. Start when you get your period (Patient not taking: Reported on 01/14/2020) 30 tablet 0   No current facility-administered medications for this visit.   Allergies: Patient has no known allergies.  No LMP recorded. (Menstrual status: IUD).  Past medical history,surgical history, problem list, medications, allergies, family history and social history were all reviewed and documented as reviewed in the EPIC chart.   OBJECTIVE:  There were no vitals taken for this visit. The patient appears well, alert, oriented x 3, in no distress.   ASSESSMENT:  44 y.o. G0P0 virtual preoperative encounter  PLAN:  We reviewed the plan for a robotic assisted total laparoscopic hysterectomy, bilateral salpingectomy, and cystoscopy.  She will retain her ovaries.  I again reviewed the hysterectomy procedure in detail with the patient including the length of time anticipated to perform the  procedure, attentional for same-day discharge but possible overnight stay in the hospital and subsequent discharge the following day, and postoperative convalescence and recovery expectations.  I discussed that she will need to plan to be off of work for potentially up to 6 weeks but may be able go back sooner a limited capacity keeping in mind lifting restrictions of 10 to 20 lb and no repetitive squatting, bending, or straining maneuvers (currently works a job with minimal physical activity).  Pelvic rest restriction was also reviewed.  The patient is aware that hysterectomy is considered to be a major surgical procedure and would preclude her from childbearing, she is absolutely certain that she does not wish to carry a pregnancy now or in the future.  There are risks of infection, bleeding, possibility of needing a blood transfusion, injury to bowel, bladder, major pelvic vessels, ureter, nerves from positioning and or skin irritation, and deep venous thrombosis. The risk of inadvertent injury to internal organs either immediately recognized or delay in recognition necessitating major exploratory reparative surgeries and future reparative surgeries including bowel resection, ostomy formation, bladder repair, ureteral damage repair was discussed with her.  General anesthesia also has risks including myocardial infarction, stroke, and death.  Common scenarios for complications from surgery were reviewed with the patient including focus on bladder and/or ureteral injuries.  Incisional complications to include opening and draining of incisions and closure by secondary intention, dehiscence, and hernia formation were reviewed. Generally the complication rate is less than 1%.  Conversion to laparotomy may be deemed necessary at the time of the procedure, which if performed would result in longer hospital stay, and more postoperative  pain and healing time.  The patient understands all of the above and agrees to proceed  as planned.  All questions were answered by the end of the visit.  Joseph Pierini MD 01/14/20

## 2020-01-14 NOTE — Patient Instructions (Addendum)
YOU ARE SCHEDULED FOR A COVID TEST _8/28________@___12 :10_________. THIS TEST MUST BE DONE BEFORE SURGERY. GO TO  [facility] [place], [place], IT IS APPROXIMATELY 2 MINUTES PAST [employer] ON THE RIGHT AND REMAIN IN YOUR CAR, THIS IS A DRIVE UP TEST. ONCE YOUR COVID TEST IS DONE PLEASE FOLLOW ALL THE QUARANTINE  INSTRUCTIONS GIVEN IN YOUR HANDOUT.      Your procedure is scheduled on 01/21/20   Report to [facility]   Call this number if you have problems the morning of surgery  :[phone].   OUR ADDRESS IS [address].  WE ARE LOCATED IN [facility].  PLEASE BRING YOUR INSURANCE CARD AND PHOTO ID DAY OF SURGERY.  ONLY ONE PERSON ALLOWED IN FACILITY WAITING AREA.                                     REMEMBER:  DO NOT EAT FOOD AFTER MIDNIGHT .  You may have clear liquids until 5:30 AM    YOU MAY  BRUSH YOUR TEETH MORNING OF SURGERY AND RINSE YOUR MOUTH OUT, NO CHEWING GUM CANDY OR MINTS.   TAKE THESE MEDICATIONS MORNING OF SURGERY WITH A SIP OF WATER:  _NONE_  ONE VISITOR IS ALLOWED IN WAITING ROOM ONLY DAY OF SURGERY.  NO VISITOR MAY SPEND THE NIGHT.  VISITOR ARE ALLOWED TO STAY UNTIL 8:00 PM.                                    DO NOT WEAR JEWERLY, MAKE UP, OR NAIL POLISH ON FINGERNAILS. DO NOT WEAR LOTIONS, POWDERS, PERFUMES OR DEODORANT. DO NOT SHAVE FOR 24 HOURS PRIOR TO DAY OF SURGERY. MEN MAY SHAVE FACE AND NECK. CONTACTS, GLASSES, OR DENTURES MAY NOT BE WORN TO SURGERY.                                    [facility] IS NOT RESPONSIBLE  FOR ANY BELONGINGS.                                                                    Marland Kitchen                                                                                                    [facility] - Preparing for Surgery  Before surgery, you can play an important role.   Because skin is not sterile, your skin needs to be as free of germs as possible.   You can  reduce the number of germs on  your skin by washing with CHG (chlorahexidine gluconate) soap before surgery.   CHG is an antiseptic cleaner which kills germs and bonds with the skin to continue killing germs even after washing. Please DO NOT use if you have an allergy to CHG or antibacterial soaps.   If your skin becomes reddened/irritated stop using the CHG and inform your nurse when you arrive at Short Stay. Do not shave (including legs and underarms) for at least 48 hours prior to the first CHG shower.   Please follow these instructions carefully:  1.  Shower with CHG Soap the night before surgery and the  morning of Surgery.  2.  If you choose to wash your hair, wash your hair first as usual with your  normal  shampoo.  3.  After you shampoo, rinse your hair and body thoroughly to remove the  shampoo.                                        4.  Use CHG as you would any other liquid soap.  You can apply chg directly  to the skin and wash                       Gently with a scrungie or clean washcloth.  5.  Apply the CHG Soap to your body ONLY FROM THE NECK DOWN.   Do not use on face/ open                           Wound or open sores. Avoid contact with eyes, ears mouth and genitals (private parts).                       Wash face,  Genitals (private parts) with your normal soap.             6.  Wash thoroughly, paying special attention to the area where your surgery  will be performed.  7.  Thoroughly rinse your body with warm water from the neck down.  8.  DO NOT shower/wash with your normal soap after using and rinsing off  the CHG Soap.             9.  Pat yourself dry with a clean towel.            10.  Wear clean pajamas.            11.  Place clean sheets on your bed the night of your first shower and do not  sleep with pets. Day of Surgery : Do not apply any lotions/deodorants the morning of surgery.  Please wear clean clothes to the hospital/surgery center.  FAILURE TO FOLLOW THESE  INSTRUCTIONS MAY RESULT IN THE CANCELLATION OF YOUR SURGERY PATIENT SIGNATURE_________________________________  NURSE SIGNATURE__________________________________  ________________________________________________________________________

## 2020-01-15 ENCOUNTER — Encounter (HOSPITAL_COMMUNITY)
Admission: RE | Admit: 2020-01-15 | Discharge: 2020-01-15 | Disposition: A | Discharge status: Home / Self Care | Payer: 59 | Source: Ambulatory Visit | Attending: Obstetrics and Gynecology | Admitting: Obstetrics and Gynecology

## 2020-01-15 ENCOUNTER — Encounter (HOSPITAL_COMMUNITY): Payer: Self-pay

## 2020-01-15 ENCOUNTER — Other Ambulatory Visit: Payer: Self-pay

## 2020-01-15 DIAGNOSIS — Z01812 Encounter for preprocedural laboratory examination: Secondary | ICD-10-CM | POA: Insufficient documentation

## 2020-01-15 HISTORY — DX: Unspecified osteoarthritis, unspecified site: M19.90

## 2020-01-15 LAB — CBC
HCT: 40.9 % (ref 36.0–46.0)
Hemoglobin: 13.2 g/dL (ref 12.0–15.0)
MCH: 27.7 pg (ref 26.0–34.0)
MCHC: 32.3 g/dL (ref 30.0–36.0)
MCV: 85.7 fL (ref 80.0–100.0)
Platelets: 286 10*3/uL (ref 150–400)
RBC: 4.77 MIL/uL (ref 3.87–5.11)
RDW: 14.1 % (ref 11.5–15.5)
WBC: 5 10*3/uL (ref 4.0–10.5)
nRBC: 0 % (ref 0.0–0.2)

## 2020-01-15 NOTE — Progress Notes (Signed)
COVID Vaccine Completed:Yes Date COVID Vaccine completed:07/30/19 COVID vaccine manufacturer:  [employer]   PCP - Dr. Barbaraann Cao Cardiologist -no   Chest x-ray - no EKG - no Stress Test - no ECHO - no Cardiac Cath - no  Sleep Study - no CPAP -   Fasting Blood Sugar -NA  Checks Blood Sugar _____ times a day  Blood Thinner Instructions:NA Aspirin Instructions: Last Dose:  Anesthesia review:   Patient denies shortness of breath, fever, cough and chest pain at PAT appointment yes   Patient verbalized understanding of instructions that were given to them at the PAT appointment. Patient was also instructed that they will need to review over the PAT instructions again at home before surgery.  Yes  Pt is active and is able to climb stairs, do housework, and ADLs without SOB

## 2020-01-17 ENCOUNTER — Other Ambulatory Visit (HOSPITAL_COMMUNITY)
Admission: RE | Admit: 2020-01-17 | Discharge: 2020-01-17 | Disposition: A | Discharge status: Home / Self Care | Payer: 59 | Source: Ambulatory Visit | Attending: Obstetrics and Gynecology | Admitting: Obstetrics and Gynecology

## 2020-01-17 DIAGNOSIS — Z01812 Encounter for preprocedural laboratory examination: Secondary | ICD-10-CM | POA: Insufficient documentation

## 2020-01-17 DIAGNOSIS — Z20822 Contact with and (suspected) exposure to covid-19: Secondary | ICD-10-CM | POA: Diagnosis not present

## 2020-01-17 LAB — SARS CORONAVIRUS 2 (TAT 6-24 HRS): SARS Coronavirus 2: NEGATIVE

## 2020-01-21 ENCOUNTER — Encounter (HOSPITAL_BASED_OUTPATIENT_CLINIC_OR_DEPARTMENT_OTHER): Payer: Self-pay | Admitting: Obstetrics and Gynecology

## 2020-01-21 ENCOUNTER — Other Ambulatory Visit: Payer: Self-pay

## 2020-01-21 ENCOUNTER — Ambulatory Visit (HOSPITAL_BASED_OUTPATIENT_CLINIC_OR_DEPARTMENT_OTHER): Payer: 59 | Admitting: Certified Registered Nurse Anesthetist

## 2020-01-21 ENCOUNTER — Telehealth: Payer: Self-pay

## 2020-01-21 ENCOUNTER — Encounter (HOSPITAL_BASED_OUTPATIENT_CLINIC_OR_DEPARTMENT_OTHER)
Admission: RE | Disposition: A | Discharge status: Home / Self Care | Payer: Self-pay | Source: Ambulatory Visit | Attending: Obstetrics and Gynecology

## 2020-01-21 ENCOUNTER — Ambulatory Visit (HOSPITAL_BASED_OUTPATIENT_CLINIC_OR_DEPARTMENT_OTHER)
Admission: RE | Admit: 2020-01-21 | Discharge: 2020-01-21 | Disposition: A | Discharge status: Home / Self Care | Payer: 59 | Source: Ambulatory Visit | Attending: Obstetrics and Gynecology | Admitting: Obstetrics and Gynecology

## 2020-01-21 DIAGNOSIS — Z6841 Body Mass Index (BMI) 40.0 and over, adult: Secondary | ICD-10-CM | POA: Insufficient documentation

## 2020-01-21 DIAGNOSIS — N939 Abnormal uterine and vaginal bleeding, unspecified: Secondary | ICD-10-CM | POA: Diagnosis present

## 2020-01-21 DIAGNOSIS — I160 Hypertensive urgency: Secondary | ICD-10-CM | POA: Diagnosis not present

## 2020-01-21 DIAGNOSIS — Z8249 Family history of ischemic heart disease and other diseases of the circulatory system: Secondary | ICD-10-CM | POA: Diagnosis not present

## 2020-01-21 DIAGNOSIS — Z79818 Long term (current) use of other agents affecting estrogen receptors and estrogen levels: Secondary | ICD-10-CM | POA: Insufficient documentation

## 2020-01-21 DIAGNOSIS — Z793 Long term (current) use of hormonal contraceptives: Secondary | ICD-10-CM | POA: Insufficient documentation

## 2020-01-21 DIAGNOSIS — Z975 Presence of (intrauterine) contraceptive device: Secondary | ICD-10-CM | POA: Insufficient documentation

## 2020-01-21 DIAGNOSIS — Z5309 Procedure and treatment not carried out because of other contraindication: Secondary | ICD-10-CM | POA: Insufficient documentation

## 2020-01-21 DIAGNOSIS — M1389 Other specified arthritis, multiple sites: Secondary | ICD-10-CM | POA: Insufficient documentation

## 2020-01-21 DIAGNOSIS — N920 Excessive and frequent menstruation with regular cycle: Secondary | ICD-10-CM | POA: Insufficient documentation

## 2020-01-21 DIAGNOSIS — N946 Dysmenorrhea, unspecified: Secondary | ICD-10-CM | POA: Diagnosis not present

## 2020-01-21 LAB — TYPE AND SCREEN
ABO/RH(D): A POS
Antibody Screen: NEGATIVE

## 2020-01-21 LAB — POCT PREGNANCY, URINE: Preg Test, Ur: NEGATIVE

## 2020-01-21 LAB — ABO/RH: ABO/RH(D): A POS

## 2020-01-21 SURGERY — XI ROBOTIC ASSISTED LAPAROSCOPIC HYSTERECTOMY AND SALPINGECTOMY
Anesthesia: General

## 2020-01-21 MED ORDER — FENTANYL CITRATE (PF) 250 MCG/5ML IJ SOLN
INTRAMUSCULAR | Status: AC
  Filled 2020-01-21: qty 5

## 2020-01-21 MED ORDER — LACTATED RINGERS IV SOLN: INTRAVENOUS | Status: DC

## 2020-01-21 MED ORDER — HYDRALAZINE HCL 20 MG/ML IJ SOLN
10.0000 mg | Freq: Once | INTRAMUSCULAR | Status: AC
  Administered 2020-01-21: 10 mg via INTRAVENOUS

## 2020-01-21 MED ORDER — MIDAZOLAM HCL 2 MG/2ML IJ SOLN
INTRAMUSCULAR | Status: AC
  Filled 2020-01-21: qty 2

## 2020-01-21 MED ORDER — CEFAZOLIN SODIUM-DEXTROSE 2-4 GM/100ML-% IV SOLN: 2.0000 g | INTRAVENOUS | Status: DC

## 2020-01-21 MED ORDER — BUPIVACAINE HCL (PF) 0.25 % IJ SOLN: 10.0000 mL | Freq: Once | INTRAMUSCULAR | Status: DC

## 2020-01-21 MED ORDER — HYDRALAZINE HCL 20 MG/ML IJ SOLN
INTRAMUSCULAR | Status: AC
  Filled 2020-01-21: qty 1

## 2020-01-21 MED ORDER — POVIDONE-IODINE 10 % EX SWAB: 2.0000 "application " | Freq: Once | CUTANEOUS | Status: DC

## 2020-01-21 MED ORDER — ROCURONIUM BROMIDE 10 MG/ML (PF) SYRINGE
PREFILLED_SYRINGE | INTRAVENOUS | Status: AC
  Filled 2020-01-21: qty 10

## 2020-01-21 MED ORDER — CEFAZOLIN SODIUM-DEXTROSE 2-4 GM/100ML-% IV SOLN
INTRAVENOUS | Status: AC
  Filled 2020-01-21: qty 100

## 2020-01-21 MED ORDER — PROPOFOL 10 MG/ML IV BOLUS
INTRAVENOUS | Status: AC
  Filled 2020-01-21: qty 40

## 2020-01-21 MED ORDER — ACETAMINOPHEN 500 MG PO TABS: 1000.0000 mg | ORAL_TABLET | Freq: Four times a day (QID) | ORAL | Status: DC | PRN

## 2020-01-21 MED ORDER — AMLODIPINE BESYLATE 5 MG PO TABS: 5.0000 mg | ORAL_TABLET | Freq: Every day | ORAL | 1 refills | Status: DC

## 2020-01-21 MED ORDER — LIDOCAINE 2% (20 MG/ML) 5 ML SYRINGE
INTRAMUSCULAR | Status: AC
  Filled 2020-01-21: qty 5

## 2020-01-21 MED ORDER — LACTATED RINGERS IV SOLN
INTRAVENOUS | Status: DC
  Administered 2020-01-21: 125 mL via INTRAVENOUS

## 2020-01-21 SURGICAL SUPPLY — 64 items
BARRIER ADHS 3X4 INTERCEED (GAUZE/BANDAGES/DRESSINGS) IMPLANT
CANISTER SUCT 3000ML PPV (MISCELLANEOUS) IMPLANT
CATH FOLEY 3WAY  5CC 16FR (CATHETERS)
CATH FOLEY 3WAY 5CC 16FR (CATHETERS) IMPLANT
COVER BACK TABLE 60X90IN (DRAPES) IMPLANT
COVER TIP SHEARS 8 DVNC (MISCELLANEOUS) IMPLANT
COVER TIP SHEARS 8MM DA VINCI (MISCELLANEOUS)
COVER WAND RF STERILE (DRAPES) IMPLANT
DECANTER SPIKE VIAL GLASS SM (MISCELLANEOUS) IMPLANT
DEFOGGER SCOPE WARMER CLEARIFY (MISCELLANEOUS) IMPLANT
DERMABOND ADVANCED (GAUZE/BANDAGES/DRESSINGS)
DERMABOND ADVANCED .7 DNX12 (GAUZE/BANDAGES/DRESSINGS) IMPLANT
DRAPE ARM DVNC X/XI (DISPOSABLE) IMPLANT
DRAPE COLUMN DVNC XI (DISPOSABLE) IMPLANT
DRAPE DA VINCI XI ARM (DISPOSABLE)
DRAPE DA VINCI XI COLUMN (DISPOSABLE)
DRAPE UTILITY XL STRL (DRAPES) IMPLANT
DURAPREP 26ML APPLICATOR (WOUND CARE) IMPLANT
ELECT REM PT RETURN 9FT ADLT (ELECTROSURGICAL)
ELECTRODE REM PT RTRN 9FT ADLT (ELECTROSURGICAL) IMPLANT
GAUZE PETROLATUM 1 X8 (GAUZE/BANDAGES/DRESSINGS) IMPLANT
GLOVE BIO SURGEON STRL SZ 6.5 (GLOVE) IMPLANT
GLOVE BIO SURGEON STRL SZ8 (GLOVE) IMPLANT
GLOVE BIOGEL PI IND STRL 7.0 (GLOVE) IMPLANT
GLOVE BIOGEL PI IND STRL 8 (GLOVE) IMPLANT
GLOVE BIOGEL PI INDICATOR 7.0 (GLOVE)
GLOVE BIOGEL PI INDICATOR 8 (GLOVE)
GOWN STRL REUS W/TWL XL LVL3 (GOWN DISPOSABLE) IMPLANT
HOLDER FOLEY CATH W/STRAP (MISCELLANEOUS) IMPLANT
IRRIG SUCT STRYKERFLOW 2 WTIP (MISCELLANEOUS)
IRRIGATION SUCT STRKRFLW 2 WTP (MISCELLANEOUS) IMPLANT
LEGGING LITHOTOMY PAIR STRL (DRAPES) IMPLANT
OBTURATOR OPTICAL STANDARD 8MM (TROCAR)
OBTURATOR OPTICAL STND 8 DVNC (TROCAR)
OBTURATOR OPTICALSTD 8 DVNC (TROCAR) IMPLANT
OCCLUDER COLPOPNEUMO (BALLOONS) IMPLANT
PACK ROBOT WH (CUSTOM PROCEDURE TRAY) IMPLANT
PACK ROBOTIC GOWN (GOWN DISPOSABLE) IMPLANT
PACK TRENDGUARD 450 HYBRID PRO (MISCELLANEOUS) IMPLANT
PAD OB MATERNITY 4.3X12.25 (PERSONAL CARE ITEMS) IMPLANT
PAD PREP 24X48 CUFFED NSTRL (MISCELLANEOUS) IMPLANT
POUCH ENDO CATCH II 15MM (MISCELLANEOUS) IMPLANT
PROTECTOR NERVE ULNAR (MISCELLANEOUS) IMPLANT
RTRCTR WOUND ALEXIS 18CM SML (INSTRUMENTS)
SAVER CELL AAL HAEMONETICS (INSTRUMENTS) IMPLANT
SEAL CANN UNIV 5-8 DVNC XI (MISCELLANEOUS) IMPLANT
SEAL XI 5MM-8MM UNIVERSAL (MISCELLANEOUS)
SET IRRIG Y TYPE TUR BLADDER L (SET/KITS/TRAYS/PACK) IMPLANT
SET TRI-LUMEN FLTR TB AIRSEAL (TUBING) IMPLANT
SUT ETHIBOND 0 (SUTURE) IMPLANT
SUT VIC AB 4-0 PS2 27 (SUTURE) IMPLANT
SUT VICRYL 0 UR6 27IN ABS (SUTURE) IMPLANT
SUT VLOC 180 0 9IN  GS21 (SUTURE)
SUT VLOC 180 0 9IN GS21 (SUTURE) IMPLANT
SUT VLOC 180 2-0 6IN GS21 (SUTURE) IMPLANT
TIP RUMI ORANGE 6.7MMX12CM (TIP) IMPLANT
TIP UTERINE 5.1X6CM LAV DISP (MISCELLANEOUS) IMPLANT
TIP UTERINE 6.7X10CM GRN DISP (MISCELLANEOUS) IMPLANT
TIP UTERINE 6.7X6CM WHT DISP (MISCELLANEOUS) IMPLANT
TIP UTERINE 6.7X8CM BLUE DISP (MISCELLANEOUS) IMPLANT
TOWEL OR 17X26 10 PK STRL BLUE (TOWEL DISPOSABLE) IMPLANT
TRENDGUARD 450 HYBRID PRO PACK (MISCELLANEOUS)
TROCAR PORT AIRSEAL 5X120 (TROCAR) IMPLANT
WATER STERILE IRR 1000ML POUR (IV SOLUTION) IMPLANT

## 2020-01-21 NOTE — Progress Notes (Signed)
   Kristen Shepherd [birth year] MRN: [id number]  HPI The patient is a 44 y.o. G0P0 who presented today for scheduled robotic assisted laparoscopic hysterectomy for refractory abnormal uterine bleeding and dysmenorrhea.  Upon admission, preop BP was 245/130, repeated at 0715-209/118, repeated at 0800, 194/117.  Dr. Kalman Shan gave an order for IV hydralazine and BP remained 200s/100s, I ordered a second dose of 10 mg IV hydralazine given at 0828 and BP down to 189/98 then to 150s/80s.  No prior known history of HTN.  BP during office visits has been 120s/70-80s (I do see at her pre op visit at surgery center last week she had an elevated BP and I was not notified of that).  She uses a Mirena IUD for contraception (not taking any estrogens), recently has also been on Megace 40 mg BID to try to help with the menorrhagia.  Generally feeling well today other than menstrual cramps/pelvic pain.   Past Medical History:  Diagnosis Date  . Arthritis    knees, elbows, shoulders, ankles, wrists  . DUB (dysfunctional uterine bleeding) 6.22.12  . Endometrial polyp 6.22.12  . IUD 11/24/10   MIRENA INSERTED 11/24/10    Past Surgical History:  Procedure Laterality Date  . HYSTEROSCOPY  6.22.12   polyp and dub  . INTRAUTERINE DEVICE INSERTION  02/05/2019   MIRENA   No current facility-administered medications on file prior to encounter.   Current Outpatient Medications on File Prior to Encounter  Medication Sig Dispense Refill  . Multiple Vitamin (MULTIVITAMIN WITH MINERALS) TABS tablet Take 1 tablet by mouth daily. One-A-Day for Women    . levonorgestrel (MIRENA) 20 MCG/24HR IUD 1 each by Intrauterine route once.       No Known Allergies     Physical Exam   BP (!) 198/89   Pulse 92   Temp 99.4 F (37.4 C) (Oral)   Resp 18   Ht 5\' 7"  (1.702 m)   Wt 130.9 kg   SpO2 100%   BMI 45.19 kg/m    General: Pleasant female, no acute distress, alert and oriented  Assessment 44 yo female with preoperative  hypertensive urgency    Plan Surgery is canceled and I discussed that it is not safe to proceed given the severe hypertension episode due to the cardiovascular and CVA risks.  Will reschedule surgery when BP is under control. STAT consult placed to the internal medicine/hospitalist service and I spoke to Dr. Maryln Gottron who I spoke with on the phone and he agreed to come evaluate the patient to assist with HTN management.     Joseph Pierini, MD 01/21/20

## 2020-01-21 NOTE — Progress Notes (Signed)
preop BP 245/130 on admission, repeated at 0715-209/118. Dr. Kalman Shan at bedside, BP repeated at 0800, 194/117. Dr Delilah Shan at bedside. Surgery to be cancelled due to elevated BP, medication ordered IV for BP. Hydralazine given at 0812. At 0816 BP 209/106.

## 2020-01-21 NOTE — Anesthesia Preprocedure Evaluation (Signed)
Anesthesia Evaluation  Patient identified by MRN, date of birth, ID band Patient awake    Reviewed: Allergy & Precautions, NPO status , Patient's Chart, lab work & pertinent test results  Airway        Dental   Pulmonary neg pulmonary ROS,           Cardiovascular hypertension,      Neuro/Psych negative neurological ROS  negative psych ROS   GI/Hepatic negative GI ROS, Neg liver ROS,   Endo/Other  Morbid obesity  Renal/GU negative Renal ROS  negative genitourinary   Musculoskeletal negative musculoskeletal ROS (+)   Abdominal   Peds negative pediatric ROS (+)  Hematology negative hematology ROS (+)   Anesthesia Other Findings   Reproductive/Obstetrics negative OB ROS                             Anesthesia Physical Anesthesia Plan  ASA:   Anesthesia Plan:    Post-op Pain Management:    Induction:   PONV Risk Score and Plan:   Airway Management Planned:   Additional Equipment:   Intra-op Plan:   Post-operative Plan:   Informed Consent:   Plan Discussed with:   Anesthesia Plan Comments: (Patient extremely hypertensive. BP down to 209/118 from 240/122. No meds, no history of hypertension.  Discussed with Dr. Delilah Shan and will reschedule case after she is seen by PCP, BP meds initiated. )        Anesthesia Quick Evaluation

## 2020-01-21 NOTE — Progress Notes (Signed)
Dr Delilah Shan at bedside. BP 204/102 at 0825, hydralazine given IV as ordered at 0828, Bp at 0830 189/89, Dr Kalman Shan checked status of BP. Dr Delilah Shan consulted medical Physician for consult for elevaed BP prior to Discharge.  0839 BP 150/94, Pt feeling nauseated,

## 2020-01-21 NOTE — Telephone Encounter (Signed)
I spoke with patient and read her all of Dr. Scarlette Ar recommendations in this note. She will call PCP right now.  I will fax over a medical clearance form for surgery to Dr. Netty Starring at [facility] in [place].

## 2020-01-21 NOTE — Progress Notes (Signed)
Discussed patient discharge . Dr Delilah Shan to prescribe BP medication. Patient to pick up at pharmacy. Discharged home ambulatory, with husband

## 2020-01-21 NOTE — Telephone Encounter (Signed)
Staff message received from Dr. Delilah Shan" "Unfortunately we were not able to do Ms. Campa's surgery today due to her having an extremely high blood pressure out of the blue.  I had a hospitalist see her while at the hospital and I started her on amlodipine 5 mg daily for blood pressure control.  I would like her to follow-up within a few days of today (if possible this week) so she can follow-up with her primary doctor for blood pressure check  Please remind her to check her blood pressure periodically at home and if systolic greater than 379 and/or diastolic greater than 432 she needs to seek emergency care  I also want the PCP to do a formal medical preoperative clearance before we try to get her scheduled for the hysterectomy again."

## 2020-01-21 NOTE — Progress Notes (Signed)
BP at 0900 155/89, walked to BR, voided QS, stated she felt much better.

## 2020-01-21 NOTE — Progress Notes (Signed)
Dr. Maryln Gottron at bedside. To update Dr. Delilah Shan

## 2020-01-21 NOTE — Discharge Instructions (Signed)
Unfortunately we could not do the surgery today due to the high blood pressure We had to give you some doses of IV blood pressure medicine to get the blood pressure down I want you to start on a daily blood pressure tablet amlodipine 5 mg, I have sent a prescription to your pharmacy I will have my office staff contact a primary care doctor to have you follow-up within a few days of discharge, I want them to check up on your blood pressure and also do a formal medical clearance before we try to get you in for a hysterectomy again If you do not get into the doctor for the weekend I want you to check in on your blood pressure at home or at a pharmacy If your blood pressure is higher than 160 top number (systolic) and/or greater than 100 bottom number (diastolic) then you need to go to the urgent care or emergency care after hours Try to only take Tylenol as needed for pain for now, try to avoid NSAID medications such as ibuprofen or naproxen as this can sometimes elevate your blood pressure

## 2020-01-21 NOTE — Consult Note (Signed)
Medical Consultation   GERLEAN CID  FVC:[id number]  DOB: 10-22-1975  DOA: 01/21/2020  PCP: Dion Body, MD    Requesting physician: Dr. Delilah Shan  Reason for consultation: Hypertension   History of Present Illness: Kristen Shepherd is an 44 y.o. female with a history of abnormal uterine bleeding and dysmenorrhea who presented for an elective laparoscopic hysterectomy with Dr. Delilah Shan this a.m. however upon admission her preop blood pressure was 245/130, 209/118 on repeat at 07:15 and 194/117 on repeat at 08 100.  Dr. Kalman Shan given order for IV hydralazine and BP remained in 200s/100s.  She was given a second dose of hydralazine 10 mg IV and BP decreased to 189/98 then to 150s/80s.  She has no known history of hypertension.  She uses a Mirena IUD for contraception and has recently been on Megace 40 mg twice daily to help with the menorrhagia.  States that lately at home she has been having significantly increased pain and has had significant pain this a.m.  Has been taking Tylenol and ibuprofen at home though has not taken ibuprofen in a few days.  She also states that this a.m. she had an episode of nausea with some vomiting while her blood pressure was elevated but this has since resolved.  Internal medicine was consulted for preop hypertension   Review of Systems:  Review of Systems  Constitutional: Negative for chills and fever.  Respiratory: Negative for cough and shortness of breath.   Cardiovascular: Negative for chest pain, palpitations and leg swelling.  Gastrointestinal: Positive for nausea and vomiting.  Genitourinary:       Menorrhagia  Musculoskeletal: Negative for myalgias.  All other systems reviewed and are negative.  As per HPI otherwise 10 point review of systems negative.     Past Medical History: Past Medical History:  Diagnosis Date  . Arthritis    knees, elbows, shoulders, ankles, wrists  . DUB (dysfunctional uterine bleeding) 6.22.12    . Endometrial polyp 6.22.12  . IUD 11/24/10   MIRENA INSERTED 11/24/10    Past Surgical History: Past Surgical History:  Procedure Laterality Date  . HYSTEROSCOPY  6.22.12   polyp and dub  . INTRAUTERINE DEVICE INSERTION  02/05/2019   MIRENA     Allergies:  No Known Allergies   Social History:  reports that she has never smoked. She has never used smokeless tobacco. She reports current alcohol use. She reports that she does not use drugs.   Family History: Family History  Problem Relation Age of Onset  . Hypertension Mother   . Breast cancer Maternal Aunt 62    Physical Exam: Vitals:   01/21/20 0900 01/21/20 0930 01/21/20 1000 01/21/20 1030  BP: (!) 155/89 (!) 144/85 (!) 157/63 133/79  Pulse:  72  79  Resp:      Temp:      TempSrc:      SpO2:  96%  97%  Weight:      Height:       Physical Exam Vitals and nursing note reviewed.  Constitutional:      Appearance: She is obese.  HENT:     Head: Normocephalic and atraumatic.  Eyes:     Conjunctiva/sclera: Conjunctivae normal.  Cardiovascular:     Rate and Rhythm: Normal rate and regular rhythm.  Pulmonary:     Effort: Pulmonary effort is normal.     Breath sounds: Normal breath sounds.  Abdominal:  General: Abdomen is flat.     Palpations: Abdomen is soft.  Musculoskeletal:        General: No swelling or tenderness.  Skin:    Coloration: Skin is not jaundiced or pale.  Neurological:     Mental Status: She is alert. Mental status is at baseline.  Psychiatric:        Mood and Affect: Mood normal.        Behavior: Behavior normal.     Data reviewed:  I have personally reviewed following labs and imaging studies Labs:  CBC: Recent Labs  Lab 01/15/20 1419  WBC 5.0  HGB 13.2  HCT 40.9  MCV 85.7  PLT 244    Basic Metabolic Panel: No results for input(s): NA, K, CL, CO2, GLUCOSE, BUN, CREATININE, CALCIUM, MG, PHOS in the last 168 hours. GFR CrCl cannot be calculated (Patient's most recent  lab result is older than the maximum 21 days allowed.). Liver Function Tests: No results for input(s): AST, ALT, ALKPHOS, BILITOT, PROT, ALBUMIN in the last 168 hours. No results for input(s): LIPASE, AMYLASE in the last 168 hours. No results for input(s): AMMONIA in the last 168 hours. Coagulation profile No results for input(s): INR, PROTIME in the last 168 hours.  Cardiac Enzymes: No results for input(s): CKTOTAL, CKMB, CKMBINDEX, TROPONINI in the last 168 hours. BNP: Invalid input(s): POCBNP CBG: No results for input(s): GLUCAP in the last 168 hours. D-Dimer No results for input(s): DDIMER in the last 72 hours. Hgb A1c No results for input(s): HGBA1C in the last 72 hours. Lipid Profile No results for input(s): CHOL, HDL, LDLCALC, TRIG, CHOLHDL, LDLDIRECT in the last 72 hours. Thyroid function studies No results for input(s): TSH, T4TOTAL, T3FREE, THYROIDAB in the last 72 hours.  Invalid input(s): FREET3 Anemia work up No results for input(s): VITAMINB12, FOLATE, FERRITIN, TIBC, IRON, RETICCTPCT in the last 72 hours. Urinalysis    Component Value Date/Time   COLORURINE Yellow 12/10/2011 0030   COLORURINE YELLOW 12/06/2011 1047   APPEARANCEUR Hazy 12/10/2011 0030   LABSPEC 1.021 12/10/2011 0030   PHURINE 5.0 12/10/2011 0030   PHURINE 5.5 12/06/2011 1047   GLUCOSEU Negative 12/10/2011 0030   HGBUR 1+ 12/10/2011 0030   HGBUR NEG 12/06/2011 1047   BILIRUBINUR Negative 12/10/2011 0030   KETONESUR Negative 12/10/2011 0030   KETONESUR NEG 12/06/2011 1047   PROTEINUR Negative 12/10/2011 0030   PROTEINUR NEG 12/06/2011 1047   UROBILINOGEN 0.2 12/06/2011 1047   NITRITE Negative 12/10/2011 0030   NITRITE NEG 12/06/2011 1047   LEUKOCYTESUR 1+ 12/10/2011 0030     Microbiology Recent Results (from the past 240 hour(s))  SARS CORONAVIRUS 2 (TAT 6-24 HRS) Nasopharyngeal Nasopharyngeal Swab     Status: None   Collection Time: 01/17/20  1:28 PM   Specimen: Nasopharyngeal Swab   Result Value Ref Range Status   SARS Coronavirus 2 NEGATIVE NEGATIVE Final    Comment: (NOTE) SARS-CoV-2 target nucleic acids are NOT DETECTED.  The SARS-CoV-2 RNA is generally detectable in upper and lower respiratory specimens during the acute phase of infection. Negative results do not preclude SARS-CoV-2 infection, do not rule out co-infections with other pathogens, and should not be used as the sole basis for treatment or other patient management decisions. Negative results must be combined with clinical observations, patient history, and epidemiological information. The expected result is Negative.  Fact Sheet for Patients: SugarRoll.be  Fact Sheet for Healthcare Providers: https://www.woods-mathews.com/  This test is not yet approved or cleared by the [place]  FDA and  has been authorized for detection and/or diagnosis of SARS-CoV-2 by FDA under an Emergency Use Authorization (EUA). This EUA will remain  in effect (meaning this test can be used) for the duration of the COVID-19 declaration under Se ction 564(b)(1) of the Act, 21 U.S.C. section 360bbb-3(b)(1), unless the authorization is terminated or revoked sooner.  Performed at [facility], [place] [address], [place],  [address]        Inpatient Medications:   Scheduled Meds: Continuous Infusions:   Radiological Exams on Admission: No results found.  Impression/Recommendations Active Problems:   Hypertensive urgency  1. Hyertensive urgency without a history of hypertension 1. Likely due to her significant abdominal pain leading to her initially planned elective hysterectomy 2. Bps significantly improved with hydralazine 10 mg IV x2 3. Would recommend good pain control as an outpatient with Tylenol and consider Norco if stronger meds are needed.  Avoid NSAIDs as this can cause hypertension 4. I would hold off on ACE inhibitor/ARB or HCTZ for now as  her large BP fluctuations can cause an AKI and these meds would worsen her renal function in this setting 5. In the meantime she can start on amlodipine 5 mg daily and follow-up with her PCP. 6. Once she has her surgery and her pain is better controlled she should be reassessed for the need for antihypertensives  2. Abnormal uterine bleeding 1. Per primary team  Plan discussed with Dr. Delilah Shan  Thank you for this consultation.   Time Spent: 40 minutes  Harold Hedge M.D. [facility] 01/21/2020, 5:08 PM

## 2020-01-22 NOTE — Telephone Encounter (Signed)
Patient called me back and asked me not to fax to Dr. Netty Starring as their office said she was no longer a patient due to time frame that has passed since she was seen.  She has appointment tomorrow morning at 8:30am at the [facility] in [place] and she said she will call me after the appointment with the name of provider and the fax # to fax the medical clearance form to.

## 2020-01-23 ENCOUNTER — Telehealth: Payer: Self-pay

## 2020-01-23 NOTE — Telephone Encounter (Signed)
Thank you for the update!

## 2020-01-23 NOTE — Telephone Encounter (Signed)
Patient called to let me know that she saw her new PCP today, Dr. Sandi Mariscal at [facility].  BP was elevated 180/100 and medication was increased and she is to return in 2 weeks.  I have her tentatively r/s for 02/16/20 for surgery but we will have to wait and see how BP does and if this MD will clear her for surgery. She understands this.  She will call me after her next visit with update.

## 2020-02-04 ENCOUNTER — Other Ambulatory Visit: Payer: Self-pay | Admitting: Family Medicine

## 2020-02-04 DIAGNOSIS — Z1231 Encounter for screening mammogram for malignant neoplasm of breast: Secondary | ICD-10-CM

## 2020-02-04 NOTE — Telephone Encounter (Signed)
thanks

## 2020-02-04 NOTE — Telephone Encounter (Signed)
I followed up with patient today because I saw her telephone call documented a few days ago with her PCP that her BP is still not normal.  She said they added another med and she is on 3 now and still at certain times of the day it is elevated. We agreed best to go ahead and r/s her surgery again as the 9/27 date was tentative assuming her BP was controlled.  I am holding 03/16/20 for her and we will see how things go with her BP.

## 2020-02-05 ENCOUNTER — Encounter (HOSPITAL_COMMUNITY): Admission: RE | Admit: 2020-02-05 | Payer: 59 | Source: Ambulatory Visit

## 2020-02-06 ENCOUNTER — Other Ambulatory Visit (HOSPITAL_COMMUNITY): Payer: 59

## 2020-02-19 ENCOUNTER — Encounter: Payer: Self-pay | Admitting: Podiatry

## 2020-02-19 ENCOUNTER — Other Ambulatory Visit: Payer: Self-pay

## 2020-02-19 ENCOUNTER — Ambulatory Visit (INDEPENDENT_AMBULATORY_CARE_PROVIDER_SITE_OTHER): Payer: 59 | Admitting: Podiatry

## 2020-02-19 ENCOUNTER — Ambulatory Visit
Admission: RE | Admit: 2020-02-19 | Discharge: 2020-02-19 | Disposition: A | Discharge status: Home / Self Care | Payer: 59 | Source: Ambulatory Visit | Attending: Family Medicine | Admitting: Family Medicine

## 2020-02-19 ENCOUNTER — Ambulatory Visit (INDEPENDENT_AMBULATORY_CARE_PROVIDER_SITE_OTHER): Payer: 59

## 2020-02-19 DIAGNOSIS — M722 Plantar fascial fibromatosis: Secondary | ICD-10-CM

## 2020-02-19 DIAGNOSIS — Q666 Other congenital valgus deformities of feet: Secondary | ICD-10-CM | POA: Diagnosis not present

## 2020-02-19 DIAGNOSIS — M76821 Posterior tibial tendinitis, right leg: Secondary | ICD-10-CM

## 2020-02-19 DIAGNOSIS — Z1231 Encounter for screening mammogram for malignant neoplasm of breast: Secondary | ICD-10-CM | POA: Insufficient documentation

## 2020-02-19 DIAGNOSIS — IMO0002 Reserved for concepts with insufficient information to code with codable children: Secondary | ICD-10-CM | POA: Insufficient documentation

## 2020-02-19 DIAGNOSIS — I10 Essential (primary) hypertension: Secondary | ICD-10-CM | POA: Insufficient documentation

## 2020-02-19 NOTE — Progress Notes (Signed)
Subjective:  Patient ID: Kristen Shepherd, female    DOB: June 11, 1975,  MRN: [id number]  Chief Complaint  Patient presents with  . Foot Pain    Patient presents today for right foot pain x 1 month  she descibes the pain as "its a dull ache all over my foot at random times"    44 y.o. female presents with the above complaint.  Patient presents with complaint of right medial foot pain as well as arch pain.  Patient states this has been going on for about a month has progressive gotten worse.  Is dull achy in nature and now it starts hurting all over the foot at random times.  She states that she is constantly on her foot work on concrete surfaces which makes it worse.  She has not seek any other treatment options he has not seen anyone else prior to see me.  Her pain scale 7 out of 10.  She has not tried any other treatment options.   Review of Systems: Negative except as noted in the HPI. Denies N/V/F/Ch.  Past Medical History:  Diagnosis Date  . Arthritis    knees, elbows, shoulders, ankles, wrists  . DUB (dysfunctional uterine bleeding) 6.22.12  . Endometrial polyp 6.22.12  . IUD 11/24/10   MIRENA INSERTED 11/24/10    Current Outpatient Medications:  .  lisinopril (ZESTRIL) 20 MG tablet, Take by mouth., Disp: , Rfl:  .  acetaminophen (TYLENOL) 500 MG tablet, Take 2 tablets (1,000 mg total) by mouth every 6 (six) hours as needed., Disp: , Rfl:  .  amLODipine (NORVASC) 5 MG tablet, Take 1 tablet (5 mg total) by mouth daily., Disp: 30 tablet, Rfl: 1 .  hydrochlorothiazide (HYDRODIURIL) 12.5 MG tablet, Take 12.5 mg by mouth daily., Disp: , Rfl:  .  levonorgestrel (MIRENA) 20 MCG/24HR IUD, 1 each by Intrauterine route once. , Disp: , Rfl:  .  Multiple Vitamin (MULTIVITAMIN WITH MINERALS) TABS tablet, Take 1 tablet by mouth daily. One-A-Day for Women, Disp: , Rfl:   Social History   Tobacco Use  Smoking Status Never Smoker  Smokeless Tobacco Never Used    Allergies  Allergen  Reactions  . Meloxicam Other (See Comments)    Felt like having a heart attack    Objective:  There were no vitals filed for this visit. There is no height or weight on file to calculate BMI. Constitutional Well developed. Well nourished.  Vascular Dorsalis pedis pulses palpable bilaterally. Posterior tibial pulses palpable bilaterally. Capillary refill normal to all digits.  No cyanosis or clubbing noted. Pedal hair growth normal.  Neurologic Normal speech. Oriented to person, place, and time. Epicritic sensation to light touch grossly present bilaterally.  Dermatologic Nails well groomed and normal in appearance. No open wounds. No skin lesions.  Orthopedic:  Pain on palpation along the course of the posterior tibial tendon including the insertion.  Pain with the resisted dorsiflexion inversion of the foot.  No pain with dorsiflexion eversion of the foot active and passive.  No pain at the Achilles tendon at the peroneal tendon at the ATFL ligament.  Gait examination shows severe pes planovalgus deformity that is rigid in nature.  Calcaneovalgus noted with too many toe signs unable to recruit the arch with dorsiflexion of the hallux.  Patient is unable to do single and double heel raises.   Radiographs: 3 views of skeletally mature adult right foot: Mild bunion deformity noted.  There is decreasing calcaneal inclination angle increase in talar declination  angle.  Anterior break in the cyma line.  Findings consistent with pes planovalgus deformity.  Moderate arthrosis noted to the midfoot.  Assessment:   1. Pes planovalgus   2. Posterior tibial tendinitis of right leg    Plan:  Patient was evaluated and treated and all questions answered.  Right posterior tibial tendinitis with underlying pes planovalgus -I explained to the patient the etiology of posterior tibial tendinitis and various treatment options were extensively discussed.  I explained to her that the driving force for  this pain is the pes planovalgus deformity which is putting a lot of stress on the tendon.  I believe patient will benefit from cam boot immobilization to allow the soft tissue to heal appropriately.  Patient agrees with the plan -Cam boot was dispensed  Pes planovalgus rigid -I explained patient the etiology of pes planovalgus and various treatment options were discussed especially in the setting of posterior tibial tendinitis.  I believe patient will benefit from custom-made orthotics to help support the minimal arch that she has and control the hindfoot motion therefore take the stress off of the posterior tibial tendon.  Patient agrees with the plan would like to proceed with obtaining orthotics as she is tends to wear elastic toe boots as well at work. -She will be scheduled to see [facility] for custom-made orthotics  No follow-ups on file.

## 2020-02-23 ENCOUNTER — Other Ambulatory Visit: Payer: Self-pay | Admitting: Podiatry

## 2020-02-23 DIAGNOSIS — M76821 Posterior tibial tendinitis, right leg: Secondary | ICD-10-CM

## 2020-03-03 ENCOUNTER — Other Ambulatory Visit: Payer: Self-pay | Admitting: Obstetrics and Gynecology

## 2020-03-09 ENCOUNTER — Ambulatory Visit: Payer: 59 | Admitting: Podiatry

## 2020-03-09 ENCOUNTER — Encounter: Payer: Self-pay | Admitting: Podiatry

## 2020-03-09 ENCOUNTER — Other Ambulatory Visit: Payer: Self-pay

## 2020-03-09 DIAGNOSIS — M76821 Posterior tibial tendinitis, right leg: Secondary | ICD-10-CM | POA: Diagnosis not present

## 2020-03-09 DIAGNOSIS — Q666 Other congenital valgus deformities of feet: Secondary | ICD-10-CM

## 2020-03-09 NOTE — Progress Notes (Signed)
Subjective:  Patient ID: Kristen Shepherd, female    DOB: 05/16/1976,  MRN: [id number]  Chief Complaint  Patient presents with  . Foot Pain    "Its no better.  It feels good when the boot is on but when I take the boot off it hurts again"    44 y.o. female presents with the above complaint.  Patient presents with a follow-up of right posterior tibial tendinitis secondary to pes planovalgus.  Patient states that she is doing about the same.  She feels great when she is in the cam boot however she is not able to transition out of the cam boot into regular sneakers.  She would like to discuss further treatment options.  She denies any other acute complaints.   Review of Systems: Negative except as noted in the HPI. Denies N/V/F/Ch.  Past Medical History:  Diagnosis Date  . Arthritis    knees, elbows, shoulders, ankles, wrists  . DUB (dysfunctional uterine bleeding) 6.22.12  . Endometrial polyp 6.22.12  . IUD 11/24/10   MIRENA INSERTED 11/24/10    Current Outpatient Medications:  .  acetaminophen (TYLENOL) 500 MG tablet, Take 2 tablets (1,000 mg total) by mouth every 6 (six) hours as needed. (Patient not taking: Reported on 02/26/2020), Disp: , Rfl:  .  amLODipine (NORVASC) 5 MG tablet, Take 1 tablet (5 mg total) by mouth daily., Disp: 30 tablet, Rfl: 1 .  B Complex-C (SUPER B COMPLEX PO), Take 1 tablet by mouth daily., Disp: , Rfl:  .  cholecalciferol (VITAMIN D3) 25 MCG (1000 UNIT) tablet, Take 1,000 Units by mouth daily., Disp: , Rfl:  .  hydrochlorothiazide (HYDRODIURIL) 25 MG tablet, Take 25 mg by mouth daily. , Disp: , Rfl:  .  levonorgestrel (MIRENA) 20 MCG/24HR IUD, 1 each by Intrauterine route once. , Disp: , Rfl:  .  lisinopril (ZESTRIL) 20 MG tablet, Take 20 mg by mouth daily. , Disp: , Rfl:  .  Multiple Vitamin (MULTIVITAMIN WITH MINERALS) TABS tablet, Take 1 tablet by mouth daily. One-A-Day for Women, Disp: , Rfl:   Social History   Tobacco Use  Smoking Status Never  Smoker  Smokeless Tobacco Never Used    Allergies  Allergen Reactions  . Meloxicam Other (See Comments)    Felt like having a heart attack    Objective:  There were no vitals filed for this visit. There is no height or weight on file to calculate BMI. Constitutional Well developed. Well nourished.  Vascular Dorsalis pedis pulses palpable bilaterally. Posterior tibial pulses palpable bilaterally. Capillary refill normal to all digits.  No cyanosis or clubbing noted. Pedal hair growth normal.  Neurologic Normal speech. Oriented to person, place, and time. Epicritic sensation to light touch grossly present bilaterally.  Dermatologic Nails well groomed and normal in appearance. No open wounds. No skin lesions.  Orthopedic:  Pain on palpation along the course of the posterior tibial tendon including the insertion.  Pain with the resisted dorsiflexion inversion of the foot.  No pain with dorsiflexion eversion of the foot active and passive.  No pain at the Achilles tendon at the peroneal tendon at the ATFL ligament.  Gait examination shows severe pes planovalgus deformity that is rigid in nature.  Calcaneovalgus noted with too many toe signs unable to recruit the arch with dorsiflexion of the hallux.  Patient is unable to do single and double heel raises.   Radiographs: 3 views of skeletally mature adult right foot: Mild bunion deformity noted.  There is  decreasing calcaneal inclination angle increase in talar declination angle.  Anterior break in the cyma line.  Findings consistent with pes planovalgus deformity.  Moderate arthrosis noted to the midfoot.  Assessment:   1. Posterior tibial tendinitis of right leg   2. Pes planovalgus    Plan:  Patient was evaluated and treated and all questions answered.  Right posterior tibial tendinitis with underlying pes planovalgus -Patient is clinically improving while being in a cam boot and completely be immobilized however she is not able  to transition into regular shoes without pain with crying on happening again.  At this time I discussed with the patient extensive detail I believe patient will benefit from steroid injection to help decrease acute inflammatory component associated pain.  I explained to the patient that given that this will be given near the tendon there is a risk of rupture associated with the.  Patient states understanding like to proceed with the injection despite the risks. -A steroid injection was performed at right medial foot point of maximal tenderness using 1% plain Lidocaine and 10 mg of Kenalog. This was well tolerated. -Tri-Lock ankle brace was also dispensed to help with the transition.  If unable to we will discuss getting an MRI during next clinical visit if there is no resolve meant of pain  Pes planovalgus rigid -I explained patient the etiology of pes planovalgus and various treatment options were discussed especially in the setting of posterior tibial tendinitis.  I believe patient will benefit from custom-made orthotics to help support the minimal arch that she has and control the hindfoot motion therefore take the stress off of the posterior tibial tendon.  Patient agrees with the plan would like to proceed with obtaining orthotics as she is tends to wear elastic toe boots as well at work. -She will be scheduled to see [facility] for custom-made orthotics  No follow-ups on file.

## 2020-03-10 ENCOUNTER — Ambulatory Visit: Payer: 59 | Admitting: Orthotics

## 2020-03-10 ENCOUNTER — Telehealth: Payer: Self-pay

## 2020-03-10 DIAGNOSIS — M76821 Posterior tibial tendinitis, right leg: Secondary | ICD-10-CM

## 2020-03-10 DIAGNOSIS — Q666 Other congenital valgus deformities of feet: Secondary | ICD-10-CM

## 2020-03-10 NOTE — Progress Notes (Signed)
Cast today for f/o to address PTTD R and Pes planus b/l.Plan   on f/o with 4* medial post, more shallow arch support du to rigid flat foot deformity; cushioning.  Patient signed ABN/FRF

## 2020-03-10 NOTE — Patient Instructions (Addendum)
DUE TO COVID-19 ONLY ONE VISITOR IS ALLOWED IN WAITING ROOM (VISITOR WILL HAVE A TEMPERATURE CHECK ON ARRIVAL AND MUST WEAR A FACE MASK THE ENTIRE TIME.)  ONCE YOU ARE ADMITTED TO YOUR PRIVATE ROOM, THE SAME ONE VISITOR IS ALLOWED TO VISIT DURING VISITING HOURS ONLY.  Your COVID swab testing is scheduled for: 03/12/20 at  2:30 am , You must self quarantine after your testing per handout given to you at the testing site. [address] [address] [place], [place] [address]  (Must self quarantine after testing. Follow instructions on handout.)       Your procedure is scheduled on: 03/16/20  Report to [facility] AT: 7:30  A. M.   Call this number if you have problems the morning of [phone].   OUR ADDRESS IS [address] [place].  WE ARE LOCATED IN THE [place]                                   [facility].                                     REMEMBER:   DO NOT EAT FOOD OR DRINK LIQUIDS AFTER MIDNIGHT .    BRUSH YOUR TEETH THE MORNING OF SURGERY.  TAKE THESE MEDICATIONS MORNING OF SURGERY WITH A SIP OF WATER:  AMLODIPINE.  DO NOT WEAR JEWERLY, MAKE UP, OR NAIL POLISH.  DO NOT WEAR LOTIONS, POWDERS, PERFUMES/COLOGNE OR DEODORANT.  DO NOT SHAVE FOR 24 HOURS PRIOR TO DAY OF SURGERY.  CONTACTS, GLASSES, OR DENTURES MAY NOT BE WORN TO SURGERY.                                    [facility] IS NOT RESPONSIBLE  FOR ANY BELONGINGS.         YOU MAY BRING A SMALL OVERNIGHT [facility] - Preparing for Surgery Before surgery, you can play an important role.  Because skin is not sterile, your skin needs to be as free of germs as possible.  You can reduce the number of germs on your skin by washing with CHG (chlorahexidine gluconate) soap before surgery.  CHG is an antiseptic cleaner which kills germs and bonds with the skin to continue killing germs even after washing. Please DO NOT use if you have an allergy to CHG or  antibacterial soaps.  If your skin becomes reddened/irritated stop using the CHG and inform your nurse when you arrive at Short Stay. Do not shave (including legs and underarms) for at least 48 hours prior to the first CHG shower.  You may shave your face/neck. Please follow these instructions carefully:  1.  Shower with CHG Soap the night before surgery and the  morning of Surgery.  2.  If you choose to wash your hair, wash your hair first as usual with your  normal  shampoo.  3.  After you shampoo, rinse your hair and body  thoroughly to remove the  shampoo.                           4.  Use CHG as you would any other liquid soap.  You can apply chg directly  to the skin and wash                       Gently with a scrungie or clean washcloth.  5.  Apply the CHG Soap to your body ONLY FROM THE NECK DOWN.   Do not use on face/ open                           Wound or open sores. Avoid contact with eyes, ears mouth and genitals (private parts).                       Wash face,  Genitals (private parts) with your normal soap.             6.  Wash thoroughly, paying special attention to the area where your surgery  will be performed.  7.  Thoroughly rinse your body with warm water from the neck down.  8.  DO NOT shower/wash with your normal soap after using and rinsing off  the CHG Soap.                9.  Pat yourself dry with a clean towel.            10.  Wear clean pajamas.            11.  Place clean sheets on your bed the night of your first shower and do not  sleep with pets. Day of Surgery : Do not apply any lotions/deodorants the morning of surgery.  Please wear clean clothes to the hospital/surgery center.  FAILURE TO FOLLOW THESE INSTRUCTIONS MAY RESULT IN THE CANCELLATION OF YOUR SURGERY PATIENT SIGNATURE_________________________________  NURSE SIGNATURE__________________________________  ________________________________________________________________________

## 2020-03-10 NOTE — Telephone Encounter (Signed)
I called and left message at PCP office regarding medical clearance for patient's surgery for next Tuesday. I had faxed the form yesterday with a note. Person who answered took the message to relay.

## 2020-03-10 NOTE — Progress Notes (Signed)
Pt. Needs orders for upcomming surgery.PST and lab. appointment on: 03/11/20.Thanks.

## 2020-03-11 ENCOUNTER — Encounter (HOSPITAL_COMMUNITY)
Admission: RE | Admit: 2020-03-11 | Discharge: 2020-03-11 | Disposition: A | Discharge status: Home / Self Care | Payer: 59 | Source: Ambulatory Visit | Attending: Obstetrics and Gynecology | Admitting: Obstetrics and Gynecology

## 2020-03-11 ENCOUNTER — Encounter (HOSPITAL_COMMUNITY): Payer: Self-pay

## 2020-03-11 ENCOUNTER — Other Ambulatory Visit: Payer: Self-pay

## 2020-03-11 DIAGNOSIS — Z01812 Encounter for preprocedural laboratory examination: Secondary | ICD-10-CM | POA: Diagnosis present

## 2020-03-11 HISTORY — DX: Essential (primary) hypertension: I10

## 2020-03-11 LAB — BASIC METABOLIC PANEL
Anion gap: 11 (ref 5–15)
BUN: 14 mg/dL (ref 6–20)
CO2: 27 mmol/L (ref 22–32)
Calcium: 9.2 mg/dL (ref 8.9–10.3)
Chloride: 100 mmol/L (ref 98–111)
Creatinine, Ser: 0.88 mg/dL (ref 0.44–1.00)
GFR, Estimated: >60 mL/min (ref >60)
Glucose, Bld: 100 mg/dL — ABNORMAL HIGH (ref 70–99)
Potassium: 3.4 mmol/L — ABNORMAL LOW (ref 3.5–5.1)
Sodium: 138 mmol/L (ref 135–145)

## 2020-03-11 LAB — CBC
HCT: 39.3 % (ref 36.0–46.0)
Hemoglobin: 12.7 g/dL (ref 12.0–15.0)
MCH: 27.8 pg (ref 26.0–34.0)
MCHC: 32.3 g/dL (ref 30.0–36.0)
MCV: 86 fL (ref 80.0–100.0)
Platelets: 307 10*3/uL (ref 150–400)
RBC: 4.57 MIL/uL (ref 3.87–5.11)
RDW: 13.5 % (ref 11.5–15.5)
WBC: 6.3 10*3/uL (ref 4.0–10.5)
nRBC: 0 % (ref 0.0–0.2)

## 2020-03-11 NOTE — Progress Notes (Signed)
COVID Vaccine Completed: Yes Date COVID Vaccine completed: 07/2019 COVID vaccine manufacturer:  [employer]   PCP -. Celena Bounvilay . PA-C. LOV: 03/03/20 Cardiologist -   Chest x-ray -  EKG - 02/06/20. Requested. Stress Test -  ECHO -  Cardiac Cath -  Pacemaker/ICD device last checked:  Sleep Study -  CPAP -   Fasting Blood Sugar -  Checks Blood Sugar _____ times a day  Blood Thinner Instructions: Aspirin Instructions: Last Dose:  Anesthesia review:   Patient denies shortness of breath, fever, cough and chest pain at PAT appointment   Patient verbalized understanding of instructions that were given to them at the PAT appointment. Patient was also instructed that they will need to review over the PAT instructions again at home before surgery.

## 2020-03-12 ENCOUNTER — Other Ambulatory Visit (HOSPITAL_COMMUNITY)
Admission: RE | Admit: 2020-03-12 | Discharge: 2020-03-12 | Disposition: A | Discharge status: Home / Self Care | Payer: 59 | Source: Ambulatory Visit | Attending: Obstetrics and Gynecology | Admitting: Obstetrics and Gynecology

## 2020-03-12 ENCOUNTER — Other Ambulatory Visit (HOSPITAL_COMMUNITY): Payer: Self-pay

## 2020-03-12 DIAGNOSIS — Z20822 Contact with and (suspected) exposure to covid-19: Secondary | ICD-10-CM | POA: Insufficient documentation

## 2020-03-12 DIAGNOSIS — Z01812 Encounter for preprocedural laboratory examination: Secondary | ICD-10-CM | POA: Diagnosis not present

## 2020-03-12 LAB — SARS CORONAVIRUS 2 (TAT 6-24 HRS): SARS Coronavirus 2: NEGATIVE

## 2020-03-15 NOTE — Anesthesia Preprocedure Evaluation (Addendum)
Anesthesia Evaluation  Patient identified by MRN, date of birth, ID band Patient awake    Reviewed: Allergy & Precautions, NPO status , Patient's Chart, lab work & pertinent test results  Airway Mallampati: III  TM Distance: >3 FB Neck ROM: Full    Dental no notable dental hx. (+) Teeth Intact, Dental Advisory Given   Pulmonary    Pulmonary exam normal breath sounds clear to auscultation       Cardiovascular Exercise Tolerance: Good hypertension, Pt. on medications Normal cardiovascular exam Rhythm:Regular Rate:Normal  HCTZ, amlodipine   Neuro/Psych  Headaches,    GI/Hepatic negative GI ROS, Neg liver ROS,   Endo/Other  negative endocrine ROS  Renal/GU negative Renal ROS     Musculoskeletal  (+) Arthritis ,   Abdominal (+) + obese,   Peds  Hematology negative hematology ROS (+) Hgb 12.7 Plt 307   Anesthesia Other Findings All: meloxicam  Reproductive/Obstetrics negative OB ROS                            Anesthesia Physical Anesthesia Plan  ASA: III  Anesthesia Plan: General   Post-op Pain Management:    Induction: Intravenous  PONV Risk Score and Plan: 4 or greater and Treatment may vary due to age or medical condition, Ondansetron, Dexamethasone and Midazolam  Airway Management Planned: LMA  Additional Equipment: None  Intra-op Plan:   Post-operative Plan:   Informed Consent: I have reviewed the patients History and Physical, chart, labs and discussed the procedure including the risks, benefits and alternatives for the proposed anesthesia with the patient or authorized representative who has indicated his/her understanding and acceptance.     Dental advisory given  Plan Discussed with: Anesthesiologist and CRNA  Anesthesia Plan Comments: (GA w ETT, w lidocaine drip + dexmedetomidine)       Anesthesia Quick Evaluation

## 2020-03-16 ENCOUNTER — Other Ambulatory Visit: Payer: Self-pay

## 2020-03-16 ENCOUNTER — Ambulatory Visit (HOSPITAL_BASED_OUTPATIENT_CLINIC_OR_DEPARTMENT_OTHER)
Admission: RE | Admit: 2020-03-16 | Discharge: 2020-03-16 | Disposition: A | Discharge status: Home / Self Care | Payer: 59 | Attending: Obstetrics and Gynecology | Admitting: Obstetrics and Gynecology

## 2020-03-16 ENCOUNTER — Encounter (HOSPITAL_BASED_OUTPATIENT_CLINIC_OR_DEPARTMENT_OTHER)
Admission: RE | Disposition: A | Discharge status: Home / Self Care | Payer: Self-pay | Source: Home / Self Care | Attending: Obstetrics and Gynecology

## 2020-03-16 ENCOUNTER — Encounter (HOSPITAL_BASED_OUTPATIENT_CLINIC_OR_DEPARTMENT_OTHER): Payer: Self-pay | Admitting: Obstetrics and Gynecology

## 2020-03-16 ENCOUNTER — Encounter: Payer: Self-pay | Admitting: Gynecology

## 2020-03-16 ENCOUNTER — Ambulatory Visit (HOSPITAL_BASED_OUTPATIENT_CLINIC_OR_DEPARTMENT_OTHER): Payer: 59 | Admitting: Anesthesiology

## 2020-03-16 DIAGNOSIS — Z5309 Procedure and treatment not carried out because of other contraindication: Secondary | ICD-10-CM | POA: Insufficient documentation

## 2020-03-16 DIAGNOSIS — Z793 Long term (current) use of hormonal contraceptives: Secondary | ICD-10-CM | POA: Diagnosis not present

## 2020-03-16 DIAGNOSIS — Z79899 Other long term (current) drug therapy: Secondary | ICD-10-CM | POA: Diagnosis not present

## 2020-03-16 DIAGNOSIS — N938 Other specified abnormal uterine and vaginal bleeding: Secondary | ICD-10-CM | POA: Insufficient documentation

## 2020-03-16 DIAGNOSIS — I10 Essential (primary) hypertension: Secondary | ICD-10-CM | POA: Diagnosis not present

## 2020-03-16 DIAGNOSIS — Z888 Allergy status to other drugs, medicaments and biological substances status: Secondary | ICD-10-CM | POA: Diagnosis not present

## 2020-03-16 DIAGNOSIS — M1990 Unspecified osteoarthritis, unspecified site: Secondary | ICD-10-CM | POA: Insufficient documentation

## 2020-03-16 LAB — TYPE AND SCREEN
ABO/RH(D): A POS
Antibody Screen: NEGATIVE

## 2020-03-16 LAB — POCT PREGNANCY, URINE: Preg Test, Ur: NEGATIVE

## 2020-03-16 SURGERY — CANCELLED PROCEDURE
Anesthesia: General

## 2020-03-16 MED ORDER — ONDANSETRON HCL 4 MG/2ML IJ SOLN
INTRAMUSCULAR | Status: DC | PRN
  Administered 2020-03-16: 4 mg via INTRAVENOUS

## 2020-03-16 MED ORDER — SUGAMMADEX SODIUM 200 MG/2ML IV SOLN
INTRAVENOUS | Status: DC | PRN
  Administered 2020-03-16: 2000 mg via INTRAVENOUS

## 2020-03-16 MED ORDER — PROPOFOL 10 MG/ML IV BOLUS
INTRAVENOUS | Status: AC
  Filled 2020-03-16: qty 40

## 2020-03-16 MED ORDER — CEFAZOLIN SODIUM-DEXTROSE 2-4 GM/100ML-% IV SOLN
INTRAVENOUS | Status: AC
  Filled 2020-03-16: qty 100

## 2020-03-16 MED ORDER — OXYCODONE HCL 5 MG PO TABS: 5.0000 mg | ORAL_TABLET | Freq: Once | ORAL | Status: DC | PRN

## 2020-03-16 MED ORDER — DEXMEDETOMIDINE (PRECEDEX) IN NS 20 MCG/5ML (4 MCG/ML) IV SYRINGE
PREFILLED_SYRINGE | INTRAVENOUS | Status: DC | PRN
  Administered 2020-03-16 (×2): 8 ug via INTRAVENOUS

## 2020-03-16 MED ORDER — AMISULPRIDE (ANTIEMETIC) 5 MG/2ML IV SOLN: 10.0000 mg | Freq: Once | INTRAVENOUS | Status: DC | PRN

## 2020-03-16 MED ORDER — LACTATED RINGERS IV SOLN: INTRAVENOUS | Status: DC

## 2020-03-16 MED ORDER — MIDAZOLAM HCL 5 MG/5ML IJ SOLN
INTRAMUSCULAR | Status: DC | PRN
  Administered 2020-03-16: 2 mg via INTRAVENOUS

## 2020-03-16 MED ORDER — ONDANSETRON HCL 4 MG/2ML IJ SOLN: 4.0000 mg | Freq: Once | INTRAMUSCULAR | Status: DC | PRN

## 2020-03-16 MED ORDER — ONDANSETRON HCL 4 MG/2ML IJ SOLN
INTRAMUSCULAR | Status: AC
  Filled 2020-03-16: qty 2

## 2020-03-16 MED ORDER — ROCURONIUM BROMIDE 10 MG/ML (PF) SYRINGE
PREFILLED_SYRINGE | INTRAVENOUS | Status: AC
  Filled 2020-03-16: qty 10

## 2020-03-16 MED ORDER — ROCURONIUM BROMIDE 100 MG/10ML IV SOLN
INTRAVENOUS | Status: DC | PRN
  Administered 2020-03-16: 70 mg via INTRAVENOUS

## 2020-03-16 MED ORDER — KETOROLAC TROMETHAMINE 30 MG/ML IJ SOLN: 30.0000 mg | Freq: Once | INTRAMUSCULAR | Status: DC | PRN

## 2020-03-16 MED ORDER — PHENYLEPHRINE HCL-NACL 10-0.9 MG/250ML-% IV SOLN
INTRAVENOUS | Status: DC | PRN
  Administered 2020-03-16: 40 ug/min via INTRAVENOUS

## 2020-03-16 MED ORDER — POVIDONE-IODINE 10 % EX SWAB
2.0000 "application " | Freq: Once | CUTANEOUS | Status: AC
  Administered 2020-03-16: 2 via TOPICAL

## 2020-03-16 MED ORDER — SUGAMMADEX SODIUM 500 MG/5ML IV SOLN
INTRAVENOUS | Status: AC
  Filled 2020-03-16: qty 20

## 2020-03-16 MED ORDER — CEFAZOLIN SODIUM-DEXTROSE 2-4 GM/100ML-% IV SOLN
2.0000 g | INTRAVENOUS | Status: AC
  Administered 2020-03-16: 2 g via INTRAVENOUS

## 2020-03-16 MED ORDER — FENTANYL CITRATE (PF) 250 MCG/5ML IJ SOLN
INTRAMUSCULAR | Status: AC
  Filled 2020-03-16: qty 5

## 2020-03-16 MED ORDER — LACTATED RINGERS IV SOLN: INTRAVENOUS | Status: DC | PRN

## 2020-03-16 MED ORDER — LIDOCAINE 2% (20 MG/ML) 5 ML SYRINGE
INTRAMUSCULAR | Status: DC | PRN
  Administered 2020-03-16: 100 mg via INTRAVENOUS

## 2020-03-16 MED ORDER — KETAMINE HCL 10 MG/ML IJ SOLN
INTRAMUSCULAR | Status: AC
  Filled 2020-03-16: qty 1

## 2020-03-16 MED ORDER — LIDOCAINE 2% (20 MG/ML) 5 ML SYRINGE
INTRAMUSCULAR | Status: AC
  Filled 2020-03-16: qty 15

## 2020-03-16 MED ORDER — OXYCODONE HCL 5 MG/5ML PO SOLN: 5.0000 mg | Freq: Once | ORAL | Status: DC | PRN

## 2020-03-16 MED ORDER — HYDROMORPHONE HCL 1 MG/ML IJ SOLN: 0.2500 mg | INTRAMUSCULAR | Status: DC | PRN

## 2020-03-16 MED ORDER — PHENYLEPHRINE 40 MCG/ML (10ML) SYRINGE FOR IV PUSH (FOR BLOOD PRESSURE SUPPORT)
PREFILLED_SYRINGE | INTRAVENOUS | Status: DC | PRN
  Administered 2020-03-16: 80 ug via INTRAVENOUS
  Administered 2020-03-16: 120 ug via INTRAVENOUS
  Administered 2020-03-16: 80 ug via INTRAVENOUS

## 2020-03-16 MED ORDER — FENTANYL CITRATE (PF) 100 MCG/2ML IJ SOLN
INTRAMUSCULAR | Status: DC | PRN
  Administered 2020-03-16: 50 ug via INTRAVENOUS

## 2020-03-16 MED ORDER — PROPOFOL 10 MG/ML IV BOLUS
INTRAVENOUS | Status: DC | PRN
  Administered 2020-03-16: 200 mg via INTRAVENOUS

## 2020-03-16 MED ORDER — DEXMEDETOMIDINE (PRECEDEX) IN NS 20 MCG/5ML (4 MCG/ML) IV SYRINGE
PREFILLED_SYRINGE | INTRAVENOUS | Status: AC
  Filled 2020-03-16: qty 5

## 2020-03-16 MED ORDER — MIDAZOLAM HCL 2 MG/2ML IJ SOLN
INTRAMUSCULAR | Status: AC
  Filled 2020-03-16: qty 2

## 2020-03-16 SURGICAL SUPPLY — 65 items
BARRIER ADHS 3X4 INTERCEED (GAUZE/BANDAGES/DRESSINGS) IMPLANT
CANISTER SUCT 3000ML PPV (MISCELLANEOUS) IMPLANT
CATH FOLEY 3WAY  5CC 16FR (CATHETERS)
CATH FOLEY 3WAY 5CC 16FR (CATHETERS) IMPLANT
COVER BACK TABLE 60X90IN (DRAPES) IMPLANT
COVER TIP SHEARS 8 DVNC (MISCELLANEOUS) IMPLANT
COVER TIP SHEARS 8MM DA VINCI (MISCELLANEOUS)
COVER WAND RF STERILE (DRAPES) IMPLANT
DECANTER SPIKE VIAL GLASS SM (MISCELLANEOUS) IMPLANT
DEFOGGER SCOPE WARMER CLEARIFY (MISCELLANEOUS) IMPLANT
DERMABOND ADVANCED (GAUZE/BANDAGES/DRESSINGS)
DERMABOND ADVANCED .7 DNX12 (GAUZE/BANDAGES/DRESSINGS) IMPLANT
DRAPE ARM DVNC X/XI (DISPOSABLE) IMPLANT
DRAPE COLUMN DVNC XI (DISPOSABLE) IMPLANT
DRAPE DA VINCI XI ARM (DISPOSABLE)
DRAPE DA VINCI XI COLUMN (DISPOSABLE)
DRAPE UTILITY XL STRL (DRAPES) IMPLANT
DURAPREP 26ML APPLICATOR (WOUND CARE) IMPLANT
ELECT REM PT RETURN 9FT ADLT (ELECTROSURGICAL)
ELECTRODE REM PT RTRN 9FT ADLT (ELECTROSURGICAL) IMPLANT
GAUZE PETROLATUM 1 X8 (GAUZE/BANDAGES/DRESSINGS) IMPLANT
GLOVE BIO SURGEON STRL SZ 6.5 (GLOVE) IMPLANT
GLOVE BIO SURGEON STRL SZ8 (GLOVE) IMPLANT
GLOVE BIOGEL PI IND STRL 7.0 (GLOVE) IMPLANT
GLOVE BIOGEL PI IND STRL 8 (GLOVE) IMPLANT
GLOVE BIOGEL PI INDICATOR 7.0 (GLOVE)
GLOVE BIOGEL PI INDICATOR 8 (GLOVE)
GOWN STRL REUS W/TWL LRG LVL3 (GOWN DISPOSABLE) IMPLANT
GOWN STRL REUS W/TWL XL LVL3 (GOWN DISPOSABLE) IMPLANT
HOLDER FOLEY CATH W/STRAP (MISCELLANEOUS) IMPLANT
IRRIG SUCT STRYKERFLOW 2 WTIP (MISCELLANEOUS)
IRRIGATION SUCT STRKRFLW 2 WTP (MISCELLANEOUS) IMPLANT
LEGGING LITHOTOMY PAIR STRL (DRAPES) IMPLANT
MANIPULATOR VCARE STD CRV RETR (MISCELLANEOUS) IMPLANT
OBTURATOR OPTICAL STANDARD 8MM (TROCAR)
OBTURATOR OPTICAL STND 8 DVNC (TROCAR)
OBTURATOR OPTICALSTD 8 DVNC (TROCAR) IMPLANT
OCCLUDER COLPOPNEUMO (BALLOONS) IMPLANT
PACK ROBOT WH (CUSTOM PROCEDURE TRAY) IMPLANT
PACK TRENDGUARD 450 HYBRID PRO (MISCELLANEOUS) IMPLANT
PAD OB MATERNITY 4.3X12.25 (PERSONAL CARE ITEMS) IMPLANT
PAD PREP 24X48 CUFFED NSTRL (MISCELLANEOUS) IMPLANT
POUCH ENDO CATCH II 15MM (MISCELLANEOUS) IMPLANT
PROTECTOR NERVE ULNAR (MISCELLANEOUS) IMPLANT
RTRCTR WOUND ALEXIS 18CM SML (INSTRUMENTS)
SAVER CELL AAL HAEMONETICS (INSTRUMENTS) IMPLANT
SEAL CANN UNIV 5-8 DVNC XI (MISCELLANEOUS) IMPLANT
SEAL XI 5MM-8MM UNIVERSAL (MISCELLANEOUS)
SET IRRIG Y TYPE TUR BLADDER L (SET/KITS/TRAYS/PACK) IMPLANT
SET TRI-LUMEN FLTR TB AIRSEAL (TUBING) IMPLANT
SUT ETHIBOND 0 (SUTURE) IMPLANT
SUT VIC AB 4-0 PS2 27 (SUTURE) IMPLANT
SUT VICRYL 0 UR6 27IN ABS (SUTURE) IMPLANT
SUT VLOC 180 0 9IN  GS21 (SUTURE)
SUT VLOC 180 0 9IN GS21 (SUTURE) IMPLANT
SUT VLOC 180 2-0 6IN GS21 (SUTURE) IMPLANT
TIP RUMI ORANGE 6.7MMX12CM (TIP) IMPLANT
TIP UTERINE 5.1X6CM LAV DISP (MISCELLANEOUS) IMPLANT
TIP UTERINE 6.7X10CM GRN DISP (MISCELLANEOUS) IMPLANT
TIP UTERINE 6.7X6CM WHT DISP (MISCELLANEOUS) IMPLANT
TIP UTERINE 6.7X8CM BLUE DISP (MISCELLANEOUS) IMPLANT
TOWEL OR 17X26 10 PK STRL BLUE (TOWEL DISPOSABLE) IMPLANT
TRENDGUARD 450 HYBRID PRO PACK (MISCELLANEOUS)
TROCAR PORT AIRSEAL 5X120 (TROCAR) IMPLANT
WATER STERILE IRR 1000ML POUR (IV SOLUTION) IMPLANT

## 2020-03-16 NOTE — Anesthesia Postprocedure Evaluation (Signed)
Anesthesia Post Note  Patient: Kristen Shepherd  Procedure(s) Performed: CANCELLED PROCEDURE     Patient location during evaluation: PACU Anesthesia Type: General Level of consciousness: awake and alert Pain management: pain level controlled Vital Signs Assessment: post-procedure vital signs reviewed and stable Respiratory status: spontaneous breathing, nonlabored ventilation, respiratory function stable and patient connected to nasal cannula oxygen Cardiovascular status: blood pressure returned to baseline and stable Postop Assessment: no apparent nausea or vomiting Anesthetic complications: no Comments:  Case cx See intraop note   No complications documented.  Last Vitals:  Vitals:   03/16/20 0900 03/16/20 0909  BP: 130/81 (!) 103/59  Pulse: 94 93  Resp: 18 19  Temp:    SpO2: 96% 92%    Last Pain:  Vitals:   03/16/20 0900  TempSrc:   PainSc: 0-No pain                 Barnet Glasgow

## 2020-03-16 NOTE — Progress Notes (Signed)
   JERRICA THORMAN 04-Oct-1975 [id number]   Surgery had to be canceled today after the patient was brought back to the operating room.  The patient was hypertensive in preop today but blood pressure was much improved over her last preoperative blood pressure measurement the first time surgery got canceled 01/21/2020.  Since then she has been working with her primary care provider regarding hypertension management.  Today she was brought back to the OR for the planned surgery and placed under anesthesia with Dr. Valma Cava, but after intubated and under general anesthesia, multiple blood pressure readings indicated significant hypotension (but this was apparently not reflected in any of the other vital signs or anesthesia monitoring parameters).  Arterial line was unable to be placed per anesthesia.  She therefore had to be woken up from anesthesia and the surgery had to be canceled due to inability to adequately and accurately monitor the blood pressure, thereby precluding the ability to safely operate on the patient today.  Please see anesthesia documentation for more details.  Afterwards, I discussed this unusual situation with the patient's significant other by phone and also with the patient after she started to wake up.  We will contact the patient again later to see how she would like to proceed.  Joseph Pierini, MD

## 2020-03-16 NOTE — Discharge Instructions (Signed)

## 2020-03-16 NOTE — H&P (Signed)
   Kristen Shepherd Dec 12, 1975 MRN: [id number]  HPI The patient is a 44 y.o. G0P0 who presents today for scheduled robotic assisted total laparoscopic hysterectomy, bilateral salpingectomy, cystoscopy for refractory abnormal uterine bleeding. Her procedure got delayed from a month ago because of unrecognized severe hypertension, and now she is on 3 antihypertensives with improved blood pressure control.  No changes to her medical history since her pre op exam, denies CP, SOB, fever/chills, dysuria.  Past Medical History:  Diagnosis Date  . Arthritis    knees, elbows, shoulders, ankles, wrists  . DUB (dysfunctional uterine bleeding) 6.22.12  . Endometrial polyp 6.22.12  . Hypertension   . IUD 11/24/10   MIRENA INSERTED 11/24/10    Past Surgical History:  Procedure Laterality Date  . HYSTEROSCOPY  6.22.12   polyp and dub  . INTRAUTERINE DEVICE INSERTION  02/05/2019   MIRENA   Allergies  Allergen Reactions  . Meloxicam Other (See Comments)    Felt like having a heart attack     No current facility-administered medications on file prior to encounter.   Current Outpatient Medications on File Prior to Encounter  Medication Sig Dispense Refill  . amLODipine (NORVASC) 5 MG tablet Take 1 tablet (5 mg total) by mouth daily. 30 tablet 1  . B Complex-C (SUPER B COMPLEX PO) Take 1 tablet by mouth daily.    . cholecalciferol (VITAMIN D3) 25 MCG (1000 UNIT) tablet Take 1,000 Units by mouth daily.    Marland Kitchen levonorgestrel (MIRENA) 20 MCG/24HR IUD 1 each by Intrauterine route once.     . Multiple Vitamin (MULTIVITAMIN WITH MINERALS) TABS tablet Take 1 tablet by mouth daily. One-A-Day for Women    . acetaminophen (TYLENOL) 500 MG tablet Take 2 tablets (1,000 mg total) by mouth every 6 (six) hours as needed. (Patient not taking: Reported on 02/26/2020)        Physical Exam   BP (!) 168/95   Pulse (!) 128   Temp 100 F (37.8 C) (Oral)   Resp 18   Ht 5\' 8"  (1.727 m)   Wt 128.7 kg   SpO2 100%    BMI 43.14 kg/m    General: Pleasant female, no acute distress, alert and oriented CV: RRR, no murmurs Pulm: good respiratory effort, CTAB     Plan Proceed with robotic assisted total laparoscopic hysterectomy, bilateral salpingectomy, cystoscopy as planned.  Discussed post operative recovery expectations and possible same day discharge. All questions answered and the patient agrees to proceed.    Joseph Pierini, MD 03/16/20

## 2020-03-16 NOTE — Transfer of Care (Signed)
Immediate Anesthesia Transfer of Care Note  Patient: Kristen Shepherd  Procedure(s) Performed: CANCELLED PROCEDURE  Patient Location: PACU  Anesthesia Type:General  Level of Consciousness: drowsy and patient cooperative  Airway & Oxygen Therapy: Patient Spontanous Breathing and Patient connected to face mask oxygen  Post-op Assessment: Report given to RN and Post -op Vital signs reviewed and stable  Post vital signs: Reviewed and stable  Last Vitals:  Vitals Value Taken Time  BP 130/81 03/16/20 0900  Temp    Pulse 92 03/16/20 0903  Resp 25 03/16/20 0903  SpO2 92 % 03/16/20 0903  Vitals shown include unvalidated device data.  Last Pain:  Vitals:   03/16/20 0900  TempSrc:   PainSc: 0-No pain         Complications: No complications documented.

## 2020-03-16 NOTE — Anesthesia Procedure Notes (Signed)
Procedure Name: Intubation Date/Time: 03/16/2020 7:37 AM Performed by: Gwyndolyn Saxon, CRNA Pre-anesthesia Checklist: Patient identified, Emergency Drugs available, Suction available and Patient being monitored Patient Re-evaluated:Patient Re-evaluated prior to induction Oxygen Delivery Method: Circle system utilized Preoxygenation: Pre-oxygenation with 100% oxygen Induction Type: IV induction Ventilation: Mask ventilation without difficulty and Oral airway inserted - appropriate to patient size Laryngoscope Size: Sabra Heck and 2 Grade View: Grade I Tube type: Oral Tube size: 7.0 mm Number of attempts: 1 Airway Equipment and Method: Patient positioned with wedge pillow and Stylet Placement Confirmation: ETT inserted through vocal cords under direct vision,  positive ETCO2 and breath sounds checked- equal and bilateral Secured at: 20 cm Tube secured with: Tape Dental Injury: Teeth and Oropharynx as per pre-operative assessment

## 2020-03-17 ENCOUNTER — Telehealth: Payer: Self-pay

## 2020-03-17 NOTE — Telephone Encounter (Signed)
I called patient and rescheduled her surgery for 04/21/20 at [facility] at 8:30am.   Covid test was scheduled for 04/17/20 at 12:05pm. She is aware of quarantine protocol. Handout and map mailed to patient.  At her request will inform her HR Dept about cancellation yesterday and new date.

## 2020-03-17 NOTE — Telephone Encounter (Signed)
Surgery has been rescheduled for 04/21/20 at 8:30am at [facility].  I spoke with Laure Kidney in Anesthesia office at [facility] regarding Dr. Lorre Munroe request regarding pre op anesthesia consult before surgery. I read her the "Special Instructions" on posting sheet I received from him.  She said she will make the anesthesiologist aware of this. She said if they are aware they will usually automatically put an "A line" in prior to surgery. She will take care of the pre op anesthesia management.

## 2020-03-18 NOTE — Telephone Encounter (Signed)
Noted! Thank you

## 2020-04-01 ENCOUNTER — Telehealth: Payer: Self-pay

## 2020-04-01 NOTE — Telephone Encounter (Signed)
I received staff message from Dr. Delilah Shan: "Hello, this is the patient that has had to have surgery canceled 2 times due to her blood pressure. I would encourage her in the next 1-2 weeks to check in with her primary doctor regarding her blood pressures to make sure that her medication regimen is optimal for her going to have surgery."  I called patient and advised this. She agreed. She will call her PCP and make visit to go by and see them and let them know what happened with both surgeries and that she is getting ready to go back to surgery and make sure her medication regimen is optimal for surgery.

## 2020-04-01 NOTE — Telephone Encounter (Signed)
Thank you :)

## 2020-04-06 ENCOUNTER — Ambulatory Visit: Payer: 59 | Admitting: Podiatry

## 2020-04-07 ENCOUNTER — Encounter: Payer: 59 | Admitting: Orthotics

## 2020-04-12 NOTE — Progress Notes (Signed)
. [id number] Kristen Shepherd [address] [place] [address] Phone: [phone] Fax: [phone]      Your procedure is scheduled on Wednesday 04/21/2020.  Report to [facility] Main Entrance "A" at 06:30 A.M., and check in at the Admitting office.  Call this number if you have problems the morning of surgery:  [phone]  Call [phone] if you have any questions prior to your surgery date Monday-Friday 8am-4pm    Remember:  Do not eat after midnight the night before your surgery  You may drink clear liquids until 05:30am the morning of your surgery.   Clear liquids allowed are: Water, Non-Citrus Juices (without pulp), Carbonated Beverages, Clear Tea, Black Coffee Only, and Gatorade    Take these medicines the morning of surgery with A SIP OF WATER: Amlodipine (Norvasc) Acetaminophen (Tylenol) - if needed for pain  As of today, STOP taking any Aspirin (unless otherwise instructed by your surgeon) Aleve, Naproxen, Ibuprofen, Motrin, Advil, Goody's, BC's, all herbal medications, fish oil, and all vitamins.                      Do not wear jewelry, make up, or nail polish            Do not wear lotions, powders, perfumes, or deodorant.            Do not shave 48 hours prior to surgery.  Men may shave face and neck.            Do not bring valuables to the hospital.            [facility] is not responsible for any belongings or valuables.  Do NOT Smoke (Tobacco/Vaping) or drink Alcohol 24 hours prior to your procedure  If you use a CPAP at night, you may bring all equipment for your overnight stay.   Contacts, glasses, dentures or bridgework may not be worn into surgery.      For patients admitted to the hospital, discharge time will be determined by your treatment team.   Patients discharged the day of surgery will not be allowed to drive home, and someone needs to stay with them for 24 hours.    Special instructions:   [facility]- Preparing For Surgery  Before surgery, you can play an important role. Because skin is not sterile, your skin needs to be as free of germs as possible. You can reduce the number of germs on your skin by washing with CHG (chlorahexidine gluconate) Soap before surgery.  CHG is an antiseptic cleaner which kills germs and bonds with the skin to continue killing germs even after washing.    Oral Hygiene is also important to reduce your risk of infection.  Remember - BRUSH YOUR TEETH THE MORNING OF SURGERY WITH YOUR REGULAR TOOTHPASTE  Please do not use if you have an allergy to CHG or antibacterial soaps. If your skin becomes reddened/irritated stop using the CHG.  Do not shave (including legs and underarms) for at least 48 hours prior to first CHG shower. It is OK to shave your face.  Please follow these instructions carefully.   1. Shower the NIGHT BEFORE SURGERY and the MORNING OF SURGERY with CHG Soap.   2. If you chose to wash your hair, wash your hair first as usual with your normal shampoo.  3. After you shampoo, rinse your hair and body thoroughly to remove the shampoo.  4. Use CHG as  you would any other liquid soap. You can apply CHG directly to the skin and wash gently with a scrungie or a clean washcloth.   5. Apply the CHG Soap to your body ONLY FROM THE NECK DOWN.  Do not use on open wounds or open sores. Avoid contact with your eyes, ears, mouth and genitals (private parts). Wash Face and genitals (private parts)  with your normal soap.   6. Wash thoroughly, paying special attention to the area where your surgery will be performed.  7. Thoroughly rinse your body with warm water from the neck down.  8. DO NOT shower/wash with your normal soap after using and rinsing off the CHG Soap.  9. Pat yourself dry with a CLEAN TOWEL.  10. Wear CLEAN PAJAMAS to bed the night before surgery  11. Place CLEAN SHEETS on your bed the night of your first shower and DO NOT SLEEP WITH  PETS.   Day of Surgery: Shower with CHG Soap as directed Wear Clean/Comfortable clothing the morning of surgery Do not apply any deodorants/lotions.   Remember to brush your teeth WITH YOUR REGULAR TOOTHPASTE.   Please read over the following fact sheets that you were given.

## 2020-04-13 ENCOUNTER — Encounter (HOSPITAL_COMMUNITY)
Admission: RE | Admit: 2020-04-13 | Discharge: 2020-04-13 | Disposition: A | Discharge status: Home / Self Care | Payer: Managed Care, Other (non HMO) | Source: Ambulatory Visit | Attending: Obstetrics and Gynecology | Admitting: Obstetrics and Gynecology

## 2020-04-13 ENCOUNTER — Other Ambulatory Visit: Payer: Self-pay

## 2020-04-13 ENCOUNTER — Encounter (HOSPITAL_COMMUNITY): Payer: Self-pay

## 2020-04-13 DIAGNOSIS — Z01818 Encounter for other preprocedural examination: Secondary | ICD-10-CM | POA: Insufficient documentation

## 2020-04-13 HISTORY — DX: Headache, unspecified: R51.9

## 2020-04-13 HISTORY — DX: Vitamin D deficiency, unspecified: E55.9

## 2020-04-13 LAB — CBC
HCT: 38.8 % (ref 36.0–46.0)
Hemoglobin: 12.2 g/dL (ref 12.0–15.0)
MCH: 27 pg (ref 26.0–34.0)
MCHC: 31.4 g/dL (ref 30.0–36.0)
MCV: 85.8 fL (ref 80.0–100.0)
Platelets: 353 10*3/uL (ref 150–400)
RBC: 4.52 MIL/uL (ref 3.87–5.11)
RDW: 14 % (ref 11.5–15.5)
WBC: 6.3 10*3/uL (ref 4.0–10.5)
nRBC: 0 % (ref 0.0–0.2)

## 2020-04-13 LAB — BASIC METABOLIC PANEL
Anion gap: 11 (ref 5–15)
BUN: 11 mg/dL (ref 6–20)
CO2: 27 mmol/L (ref 22–32)
Calcium: 9.7 mg/dL (ref 8.9–10.3)
Chloride: 98 mmol/L (ref 98–111)
Creatinine, Ser: 0.87 mg/dL (ref 0.44–1.00)
GFR, Estimated: >60 mL/min (ref >60)
Glucose, Bld: 106 mg/dL — ABNORMAL HIGH (ref 70–99)
Potassium: 3.3 mmol/L — ABNORMAL LOW (ref 3.5–5.1)
Sodium: 136 mmol/L (ref 135–145)

## 2020-04-13 LAB — TYPE AND SCREEN
ABO/RH(D): A POS
Antibody Screen: NEGATIVE

## 2020-04-13 NOTE — Progress Notes (Signed)
PCP - S.BOUNVILAY  PA Cardiologist - NA  -    Chest x-ray - NA   EKG - DONE 04/11/20     ALSO REQ. FROM [place] Stress Test - NA ECHO NA-  Cardiac Cath - NA    FBlood Thinner Instructions:NA Aspirin Instructions:STOP  ERAS Protcol -INSTRUCTIONS GIVEN PRE-SURGERY Ensure or G2- NA  COVID TEST- FOR SAT   Anesthesia review: JAMES TO SPEAK WITH PT REG. HTN AND F/U    Patient denies shortness of breath, fever, cough and chest pain at PAT appointment   All instructions explained to the patient, with a verbal understanding of the material. Patient agrees to go over the instructions while at home for a better understanding. Patient also instructed to self quarantine after being tested for COVID-19. The opportunity to ask questions was provided.

## 2020-04-13 NOTE — Progress Notes (Addendum)
Anesthesia Chart Review:  Patient was originally scheduled to have the surgery 01/21/2020 but was canceled on day of surgery due to uncontrolled hypertension in preop (245/130 initially and then 194/117 on recheck, 189/89 after administration of hydralazine).  She was subsequently seen by her primary care and started on amlodipine, HCTZ, lisinopril.  She re presented for surgery on 03/16/2020.  Blood pressure was 160/95.  Surgery had to be aborted as patient became hypotensive on induction.  There was no loss of pulse ox, EtCO2, or EKG. ultrasound was obtained for A-line placement however no radial pulse is observed.  At that point, decision was made to wake patient up for safety.  Dr. Valma Cava commented on proceeding with surgery in the future, "Discussion with Dr Delilah Shan. Believe that patients BP still labile as on 3 meds. No bowel prep but still NPO. Perhaps patients BP on new meds still equilibrating. If Robot still necessary . Recommend doing this patient in several weeks at [facility]. With Aline pre induction and using less vasoactive induction agents : etomidate or Ketamine."  Recently seen by her PCP Dionicia Abler, PA-C on 04/12/20 for follow-up of preoperative blood pressure optimization. Per note, "Essential hypertension: her rescheduled surgery on 03/16/20 was cancelled due to hypotension per anesthesia. She was hypertensive during preop (168/95) which is much improved from previous. However, she was found to be hypotensive by anesthesia immediately prior to surgery. She denies use of additional blood pressure medications that morning or during preop. Discussed case with Dr. Baldemar Lenis and he agrees for no changes to her current medications given she has shown to be well-controlled both in office and with her home readings. At her initial visit after starting her current BP regimen of amlodipine 10 mg daily, HCTZ 25 mg daily, and lisinopril 20 mg, BP on 02/06/20 was found to be 132/86. Follow-up BP on  03/03/20 was 126/84 and today she is also well-controlled at 128/74. She denies hypotensive episodes at home and is getting similar readings, typically 120s/80s, rarely 140s/80s. Will have her continue medications as previous. Continue to work on routine exercise and healthy eating habits. Use walking as a stress outlet. She has a preop appointment tomorrow and will keep me updated. Continue to monitor BP daily. Return precautions discussed."  I spoke with patient at her PAT appointment on 04/13/2020 to review her recent history.  She states that prior to her initial surgery cancellation, she had no idea that she was hypertensive.  She had never been treated for hypertension previously.  She denies any history of cardiac problems.  She reports that she is now regularly monitoring her her blood pressure at home and has consistently been getting readings of ~140/90 on her current medication regimen.  She says her heart rate is generally around 90.  She denies any cardiovascular symptoms.  She denies any shortness of breath at rest or with activity, denies any history of syncope, denies any history of chest pain.  Denies any history of cardiac problems or sudden death in her family.  She is able to go up 2 flights of stairs without problems.  Her activity is not limited by any chest pain or shortness of breath.  On exam she is well-appearing.  Auscultation reveals regular rate and rhythm, no M/R/G.  Lungs CTAB.  Pressure today 153/83. EKG shows sinus tachycardia rate 101, low voltage QRS.  Otherwise normal.  Given her recent follow-up with PCP, stable blood pressures on current medication regimen, and benign physical exam, it would appear  reasonable to proceed with surgery as planned.    Per protocol, she was instructed to hold lisinopril and HCTZ on DOS.  Reviewed history with Dr. Kalman Shan. Advised pt should have a line placed prior to induction.   Preop labs reviewed, unremarkable.   EKG 04/13/2020: Sinus  tachycardia.  Rate 101. Low voltage QRS   Wynonia Musty [facility] Stay [facility]/Anesthesiology Phone [phone] 04/13/2020 10:23 AM

## 2020-04-13 NOTE — Anesthesia Preprocedure Evaluation (Addendum)
Anesthesia Evaluation  Patient identified by MRN, date of birth, ID band Patient awake    Reviewed: Allergy & Precautions, NPO status , Patient's Chart, lab work & pertinent test results  History of Anesthesia Complications Negative for: history of anesthetic complications  Airway Mallampati: II  TM Distance: >3 FB Neck ROM: Full    Dental  (+) Dental Advisory Given, Teeth Intact   Pulmonary neg pulmonary ROS, neg shortness of breath, neg sleep apnea, neg COPD, neg recent URI,  Covid-19 Nucleic Acid Test Results Lab Results      Component                Value               Date                      SARSCOV2NAA              NEGATIVE            04/17/2020                [place]              NEGATIVE            03/12/2020                [place]              NEGATIVE            01/17/2020              breath sounds clear to auscultation       Cardiovascular hypertension, Pt. on medications  Rhythm:Regular     Neuro/Psych  Headaches,  Neuromuscular disease negative psych ROS   GI/Hepatic negative GI ROS, Neg liver ROS,   Endo/Other  Morbid obesity  Renal/GU Lab Results      Component                Value               Date                      CREATININE               0.87                04/13/2020                Musculoskeletal  (+) Arthritis ,   Abdominal   Peds  Hematology negative hematology ROS (+) Lab Results      Component                Value               Date                      WBC                      6.3                 04/13/2020                HGB                      12.2                04/13/2020  HCT                      38.8                04/13/2020                MCV                      85.8                04/13/2020                PLT                      353                 04/13/2020              Anesthesia Other Findings   Reproductive/Obstetrics                             Anesthesia Physical Anesthesia Plan  ASA: III  Anesthesia Plan: General   Post-op Pain Management:    Induction: Intravenous  PONV Risk Score and Plan: 3 and Ondansetron and Dexamethasone  Airway Management Planned: Oral ETT  Additional Equipment: Arterial line  Intra-op Plan:   Post-operative Plan: Extubation in OR  Informed Consent: I have reviewed the patients History and Physical, chart, labs and discussed the procedure including the risks, benefits and alternatives for the proposed anesthesia with the patient or authorized representative who has indicated his/her understanding and acceptance.     Dental advisory given  Plan Discussed with: CRNA and Surgeon  Anesthesia Plan Comments: (PAT note by Karoline Caldwell, PA-C: Patient was originally scheduled to have the surgery 01/21/2020 but was canceled on day of surgery due to uncontrolled hypertension in preop (245/130 initially and then 194/117 on recheck, 189/89 after administration of hydralazine).  She was subsequently seen by her primary care and started on amlodipine, HCTZ, lisinopril.  She re presented for surgery on 03/16/2020.  Blood pressure was 160/95.  Surgery had to be aborted as patient became hypotensive on induction.  There was no loss of pulse ox, EtCO2, or EKG. ultrasound was obtained for A-line placement however no radial pulse is observed.  At that point, decision was made to wake patient up for safety.  Dr. Valma Cava commented on proceeding with surgery in the future, "Discussion with Dr Delilah Shan. Believe that patients BP still labile as on 3 meds. No bowel prep but still NPO. Perhaps patients BP on new meds still equilibrating. If Robot still necessary . Recommend doing this patient in several weeks at [facility]. With Aline pre induction and using less vasoactive induction agents : etomidate or Ketamine."  Recently seen by her PCP Dionicia Abler, PA-C on 04/12/20 for  follow-up of preoperative blood pressure optimization. Per note, "Essential hypertension: her rescheduled surgery on 03/16/20 was cancelled due to hypotension per anesthesia. She was hypertensive during preop (168/95) which is much improved from previous. However, she was found to be hypotensive by anesthesia immediately prior to surgery. She denies use of additional blood pressure medications that morning or during preop. Discussed case with Dr. Baldemar Lenis and he agrees for no changes to her current medications given she has shown to be well-controlled both in office and with her home readings. At her initial visit after starting her  current BP regimen of amlodipine 10 mg daily, HCTZ 25 mg daily, and lisinopril 20 mg, BP on 02/06/20 was found to be 132/86. Follow-up BP on 03/03/20 was 126/84 and today she is also well-controlled at 128/74. She denies hypotensive episodes at home and is getting similar readings, typically 120s/80s, rarely 140s/80s. Will have her continue medications as previous. Continue to work on routine exercise and healthy eating habits. Use walking as a stress outlet. She has a preop appointment tomorrow and will keep me updated. Continue to monitor BP daily. Return precautions discussed."  I spoke with patient at her PAT appointment on 04/13/2020 to review her recent history.  She states that prior to her initial surgery cancellation, she had no idea that she was hypertensive.  She had never been treated for hypertension previously.  She denies any history of cardiac problems.  She reports that she is now regularly monitoring her her blood pressure at home and has consistently been getting readings of ~140/90 on her current medication regimen.  She says her heart rate is generally around 90.  She denies any cardiovascular symptoms.  She denies any shortness of breath at rest or with activity, denies any history of syncope, denies any history of chest pain.  Denies any history of cardiac problems  or sudden death in her family.  She is able to go up 2 flights of stairs without problems.  Her activity is not limited by any chest pain or shortness of breath.  On exam she is well-appearing.  Auscultation reveals regular rate and rhythm, no M/R/G.  Lungs CTAB.  Pressure today 153/83. EKG shows sinus tachycardia rate 101, low voltage QRS.  Otherwise normal.  Given her recent follow-up with PCP, stable blood pressures on current medication regimen, and benign physical exam, it would appear reasonable to proceed with surgery as planned.    Per protocol, she was instructed to hold lisinopril and HCTZ on DOS.  Reviewed history with Dr. Kalman Shan. Advised pt should have a line placed prior to induction.   Preop labs reviewed, unremarkable.   EKG 04/13/2020: Sinus tachycardia.  Rate 101. Low voltage QRS)      Anesthesia Quick Evaluation

## 2020-04-17 ENCOUNTER — Other Ambulatory Visit (HOSPITAL_COMMUNITY)
Admission: RE | Admit: 2020-04-17 | Discharge: 2020-04-17 | Disposition: A | Discharge status: Home / Self Care | Payer: Managed Care, Other (non HMO) | Source: Ambulatory Visit | Attending: Obstetrics and Gynecology | Admitting: Obstetrics and Gynecology

## 2020-04-17 DIAGNOSIS — Z01812 Encounter for preprocedural laboratory examination: Secondary | ICD-10-CM | POA: Diagnosis present

## 2020-04-17 DIAGNOSIS — Z20822 Contact with and (suspected) exposure to covid-19: Secondary | ICD-10-CM | POA: Diagnosis not present

## 2020-04-18 LAB — SARS CORONAVIRUS 2 (TAT 6-24 HRS): SARS Coronavirus 2: NEGATIVE

## 2020-04-20 MED ORDER — DEXTROSE 5 % IV SOLN
3.0000 g | INTRAVENOUS | Status: DC
  Filled 2020-04-20: qty 3000

## 2020-04-21 ENCOUNTER — Observation Stay (HOSPITAL_COMMUNITY)
Admission: RE | Admit: 2020-04-21 | Discharge: 2020-04-22 | Disposition: A | Discharge status: Home / Self Care | Payer: Managed Care, Other (non HMO) | Attending: Obstetrics and Gynecology | Admitting: Obstetrics and Gynecology

## 2020-04-21 ENCOUNTER — Encounter (HOSPITAL_COMMUNITY)
Admission: RE | Disposition: A | Discharge status: Home / Self Care | Payer: Self-pay | Source: Home / Self Care | Attending: Obstetrics and Gynecology

## 2020-04-21 ENCOUNTER — Encounter (HOSPITAL_COMMUNITY): Payer: Self-pay | Admitting: Obstetrics and Gynecology

## 2020-04-21 ENCOUNTER — Other Ambulatory Visit: Payer: Self-pay

## 2020-04-21 ENCOUNTER — Ambulatory Visit (HOSPITAL_COMMUNITY): Payer: Managed Care, Other (non HMO) | Admitting: Anesthesiology

## 2020-04-21 DIAGNOSIS — N939 Abnormal uterine and vaginal bleeding, unspecified: Principal | ICD-10-CM | POA: Insufficient documentation

## 2020-04-21 DIAGNOSIS — Z79899 Other long term (current) drug therapy: Secondary | ICD-10-CM | POA: Insufficient documentation

## 2020-04-21 DIAGNOSIS — Z30430 Encounter for insertion of intrauterine contraceptive device: Secondary | ICD-10-CM | POA: Insufficient documentation

## 2020-04-21 DIAGNOSIS — Z90722 Acquired absence of ovaries, bilateral: Secondary | ICD-10-CM | POA: Diagnosis not present

## 2020-04-21 DIAGNOSIS — Z90710 Acquired absence of both cervix and uterus: Secondary | ICD-10-CM | POA: Diagnosis not present

## 2020-04-21 DIAGNOSIS — I10 Essential (primary) hypertension: Secondary | ICD-10-CM | POA: Insufficient documentation

## 2020-04-21 HISTORY — PX: ROBOTIC ASSISTED LAPAROSCOPIC HYSTERECTOMY AND SALPINGECTOMY: SHX6379

## 2020-04-21 LAB — HEMOGLOBIN: Hemoglobin: 11.5 g/dL — ABNORMAL LOW (ref 12.0–15.0)

## 2020-04-21 LAB — POCT PREGNANCY, URINE: Preg Test, Ur: NEGATIVE

## 2020-04-21 SURGERY — XI ROBOTIC ASSISTED LAPAROSCOPIC HYSTERECTOMY AND SALPINGECTOMY
Anesthesia: General | Site: Abdomen

## 2020-04-21 MED ORDER — KETOROLAC TROMETHAMINE 30 MG/ML IJ SOLN
30.0000 mg | Freq: Four times a day (QID) | INTRAMUSCULAR | Status: AC
  Administered 2020-04-21 – 2020-04-22 (×4): 30 mg via INTRAVENOUS
  Filled 2020-04-21 (×4): qty 1

## 2020-04-21 MED ORDER — ONDANSETRON HCL 4 MG/2ML IJ SOLN
INTRAMUSCULAR | Status: DC | PRN
  Administered 2020-04-21: 4 mg via INTRAVENOUS

## 2020-04-21 MED ORDER — KETAMINE HCL 100 MG/ML IJ SOLN
INTRAMUSCULAR | Status: AC
  Filled 2020-04-21: qty 2

## 2020-04-21 MED ORDER — BUPIVACAINE HCL (PF) 0.25 % IJ SOLN
INTRAMUSCULAR | Status: DC | PRN
  Administered 2020-04-21: 15 mL

## 2020-04-21 MED ORDER — PHENYLEPHRINE HCL-NACL 10-0.9 MG/250ML-% IV SOLN
INTRAVENOUS | Status: DC | PRN
  Administered 2020-04-21: 20 ug/min via INTRAVENOUS

## 2020-04-21 MED ORDER — CHLORHEXIDINE GLUCONATE 0.12 % MT SOLN
OROMUCOSAL | Status: AC
  Administered 2020-04-21: 15 mL via OROMUCOSAL
  Filled 2020-04-21: qty 15

## 2020-04-21 MED ORDER — DEXTROSE 5 % IV SOLN
INTRAVENOUS | Status: DC | PRN
  Administered 2020-04-21: 3 g via INTRAVENOUS

## 2020-04-21 MED ORDER — OXYCODONE HCL 5 MG/5ML PO SOLN: 5.0000 mg | Freq: Once | ORAL | Status: AC | PRN

## 2020-04-21 MED ORDER — SODIUM CHLORIDE (PF) 0.9 % IJ SOLN
INTRAMUSCULAR | Status: AC
  Filled 2020-04-21: qty 50

## 2020-04-21 MED ORDER — DEXAMETHASONE SODIUM PHOSPHATE 10 MG/ML IJ SOLN
INTRAMUSCULAR | Status: DC | PRN
  Administered 2020-04-21: 10 mg via INTRAVENOUS

## 2020-04-21 MED ORDER — DOCUSATE SODIUM 100 MG PO CAPS
100.0000 mg | ORAL_CAPSULE | Freq: Two times a day (BID) | ORAL | Status: DC
  Administered 2020-04-21 – 2020-04-22 (×3): 100 mg via ORAL
  Filled 2020-04-21 (×3): qty 1

## 2020-04-21 MED ORDER — ROCURONIUM BROMIDE 10 MG/ML (PF) SYRINGE
PREFILLED_SYRINGE | INTRAVENOUS | Status: DC | PRN
  Administered 2020-04-21: 50 mg via INTRAVENOUS
  Administered 2020-04-21: 20 mg via INTRAVENOUS
  Administered 2020-04-21: 100 mg via INTRAVENOUS

## 2020-04-21 MED ORDER — LACTATED RINGERS IV SOLN: INTRAVENOUS | Status: DC

## 2020-04-21 MED ORDER — OXYCODONE HCL 5 MG PO TABS: 5.0000 mg | ORAL_TABLET | Freq: Once | ORAL | Status: AC | PRN

## 2020-04-21 MED ORDER — ACETAMINOPHEN 10 MG/ML IV SOLN
INTRAVENOUS | Status: DC | PRN
  Administered 2020-04-21: 1000 mg via INTRAVENOUS

## 2020-04-21 MED ORDER — ACETAMINOPHEN 500 MG PO TABS: 1000.0000 mg | ORAL_TABLET | Freq: Four times a day (QID) | ORAL | Status: AC

## 2020-04-21 MED ORDER — ROCURONIUM BROMIDE 10 MG/ML (PF) SYRINGE
PREFILLED_SYRINGE | INTRAVENOUS | Status: AC
  Filled 2020-04-21: qty 10

## 2020-04-21 MED ORDER — MIDAZOLAM HCL 2 MG/2ML IJ SOLN
INTRAMUSCULAR | Status: AC
  Filled 2020-04-21: qty 2

## 2020-04-21 MED ORDER — 0.9 % SODIUM CHLORIDE (POUR BTL) OPTIME
TOPICAL | Status: DC | PRN
  Administered 2020-04-21: 1000 mL

## 2020-04-21 MED ORDER — IBUPROFEN 800 MG PO TABS: 800.0000 mg | ORAL_TABLET | Freq: Four times a day (QID) | ORAL | Status: DC

## 2020-04-21 MED ORDER — FENTANYL CITRATE (PF) 100 MCG/2ML IJ SOLN
25.0000 ug | INTRAMUSCULAR | Status: DC | PRN
  Administered 2020-04-21: 50 ug via INTRAVENOUS

## 2020-04-21 MED ORDER — FENTANYL CITRATE (PF) 250 MCG/5ML IJ SOLN
INTRAMUSCULAR | Status: AC
  Filled 2020-04-21: qty 5

## 2020-04-21 MED ORDER — SIMETHICONE 80 MG PO CHEW: 80.0000 mg | CHEWABLE_TABLET | Freq: Four times a day (QID) | ORAL | Status: DC | PRN

## 2020-04-21 MED ORDER — CHLORHEXIDINE GLUCONATE 0.12 % MT SOLN: 15.0000 mL | Freq: Once | OROMUCOSAL | Status: AC

## 2020-04-21 MED ORDER — ONDANSETRON HCL 4 MG/2ML IJ SOLN: 4.0000 mg | Freq: Four times a day (QID) | INTRAMUSCULAR | Status: DC | PRN

## 2020-04-21 MED ORDER — PROPOFOL 10 MG/ML IV BOLUS
INTRAVENOUS | Status: AC
  Filled 2020-04-21: qty 20

## 2020-04-21 MED ORDER — DOCUSATE SODIUM 100 MG PO CAPS: 100.0000 mg | ORAL_CAPSULE | Freq: Two times a day (BID) | ORAL | Status: DC | PRN

## 2020-04-21 MED ORDER — OXYCODONE HCL 5 MG/5ML PO SOLN
ORAL | Status: AC
  Administered 2020-04-21: 5 mg via ORAL
  Filled 2020-04-21: qty 5

## 2020-04-21 MED ORDER — OXYCODONE HCL 5 MG PO TABS
5.0000 mg | ORAL_TABLET | Freq: Four times a day (QID) | ORAL | 0 refills | Status: DC | PRN
Start: 2020-04-21 — End: 2020-05-06

## 2020-04-21 MED ORDER — ONDANSETRON HCL 4 MG/2ML IJ SOLN
INTRAMUSCULAR | Status: AC
  Filled 2020-04-21: qty 2

## 2020-04-21 MED ORDER — LIDOCAINE 2% (20 MG/ML) 5 ML SYRINGE
INTRAMUSCULAR | Status: DC | PRN
  Administered 2020-04-21: 100 mg via INTRAVENOUS

## 2020-04-21 MED ORDER — POVIDONE-IODINE 10 % EX SWAB
2.0000 "application " | Freq: Once | CUTANEOUS | Status: AC
  Administered 2020-04-21: 2 via TOPICAL

## 2020-04-21 MED ORDER — ACETAMINOPHEN 500 MG PO TABS
1000.0000 mg | ORAL_TABLET | Freq: Four times a day (QID) | ORAL | Status: DC
  Administered 2020-04-21 – 2020-04-22 (×4): 1000 mg via ORAL
  Filled 2020-04-21 (×4): qty 2

## 2020-04-21 MED ORDER — PHENYLEPHRINE 40 MCG/ML (10ML) SYRINGE FOR IV PUSH (FOR BLOOD PRESSURE SUPPORT)
PREFILLED_SYRINGE | INTRAVENOUS | Status: DC | PRN
  Administered 2020-04-21 (×2): 40 ug via INTRAVENOUS

## 2020-04-21 MED ORDER — SODIUM CHLORIDE (PF) 0.9 % IJ SOLN
INTRAMUSCULAR | Status: AC
  Filled 2020-04-21: qty 10

## 2020-04-21 MED ORDER — ONDANSETRON HCL 4 MG PO TABS: 4.0000 mg | ORAL_TABLET | Freq: Four times a day (QID) | ORAL | Status: DC | PRN

## 2020-04-21 MED ORDER — IBUPROFEN 200 MG PO TABS: 600.0000 mg | ORAL_TABLET | Freq: Four times a day (QID) | ORAL | Status: AC

## 2020-04-21 MED ORDER — ACETAMINOPHEN 10 MG/ML IV SOLN
INTRAVENOUS | Status: AC
  Administered 2020-04-21: 1000 mg via INTRAVENOUS
  Filled 2020-04-21: qty 100

## 2020-04-21 MED ORDER — ORAL CARE MOUTH RINSE: 15.0000 mL | Freq: Once | OROMUCOSAL | Status: AC

## 2020-04-21 MED ORDER — SODIUM CHLORIDE 0.9 % IR SOLN
Status: DC | PRN
  Administered 2020-04-21: 3000 mL

## 2020-04-21 MED ORDER — FENTANYL CITRATE (PF) 100 MCG/2ML IJ SOLN
INTRAMUSCULAR | Status: AC
  Administered 2020-04-21: 50 ug via INTRAVENOUS
  Filled 2020-04-21: qty 2

## 2020-04-21 MED ORDER — DEXAMETHASONE SODIUM PHOSPHATE 10 MG/ML IJ SOLN
INTRAMUSCULAR | Status: AC
  Filled 2020-04-21: qty 1

## 2020-04-21 MED ORDER — KETAMINE HCL 100 MG/ML IJ SOLN
INTRAMUSCULAR | Status: DC | PRN
  Administered 2020-04-21: 70 mg via INTRAVENOUS
  Administered 2020-04-21: 20 mg via INTRAVENOUS
  Administered 2020-04-21 (×2): 10 mg via INTRAVENOUS

## 2020-04-21 MED ORDER — ACETAMINOPHEN 500 MG PO TABS: 1000.0000 mg | ORAL_TABLET | Freq: Once | ORAL | Status: DC | PRN

## 2020-04-21 MED ORDER — MIDAZOLAM HCL 5 MG/5ML IJ SOLN
INTRAMUSCULAR | Status: DC | PRN
  Administered 2020-04-21: 2 mg via INTRAVENOUS

## 2020-04-21 MED ORDER — LACTATED RINGERS IV SOLN: INTRAVENOUS | Status: DC | PRN

## 2020-04-21 MED ORDER — PROPOFOL 10 MG/ML IV BOLUS
INTRAVENOUS | Status: DC | PRN
  Administered 2020-04-21: 110 mg via INTRAVENOUS

## 2020-04-21 MED ORDER — ALUM & MAG HYDROXIDE-SIMETH 200-200-20 MG/5ML PO SUSP: 30.0000 mL | ORAL | Status: DC | PRN

## 2020-04-21 MED ORDER — FENTANYL CITRATE (PF) 250 MCG/5ML IJ SOLN
INTRAMUSCULAR | Status: DC | PRN
  Administered 2020-04-21: 25 ug via INTRAVENOUS
  Administered 2020-04-21: 100 ug via INTRAVENOUS
  Administered 2020-04-21: 25 ug via INTRAVENOUS
  Administered 2020-04-21: 50 ug via INTRAVENOUS

## 2020-04-21 MED ORDER — OXYCODONE HCL 5 MG PO TABS: 5.0000 mg | ORAL_TABLET | ORAL | Status: DC | PRN

## 2020-04-21 MED ORDER — BUPIVACAINE HCL (PF) 0.25 % IJ SOLN
INTRAMUSCULAR | Status: AC
  Filled 2020-04-21: qty 30

## 2020-04-21 MED ORDER — ACETAMINOPHEN 160 MG/5ML PO SOLN: 1000.0000 mg | Freq: Once | ORAL | Status: DC | PRN

## 2020-04-21 MED ORDER — ROPIVACAINE HCL 5 MG/ML IJ SOLN
INTRAMUSCULAR | Status: AC
  Filled 2020-04-21: qty 60

## 2020-04-21 MED ORDER — ALBUMIN HUMAN 5 % IV SOLN: INTRAVENOUS | Status: DC | PRN

## 2020-04-21 MED ORDER — ACETAMINOPHEN 10 MG/ML IV SOLN: 1000.0000 mg | Freq: Once | INTRAVENOUS | Status: DC | PRN

## 2020-04-21 SURGICAL SUPPLY — 65 items
BARRIER ADHS 3X4 INTERCEED (GAUZE/BANDAGES/DRESSINGS) IMPLANT
CANISTER SUCT 3000ML PPV (MISCELLANEOUS) ×3 IMPLANT
CATH FOLEY 3WAY  5CC 16FR (CATHETERS) ×1
CATH FOLEY 3WAY 5CC 16FR (CATHETERS) ×2 IMPLANT
COVER BACK TABLE 60X90IN (DRAPES) ×3 IMPLANT
COVER TIP SHEARS 8 DVNC (MISCELLANEOUS) ×2 IMPLANT
COVER TIP SHEARS 8MM DA VINCI (MISCELLANEOUS) ×1
COVER WAND RF STERILE (DRAPES) ×3 IMPLANT
DECANTER SPIKE VIAL GLASS SM (MISCELLANEOUS) ×6 IMPLANT
DEFOGGER SCOPE WARMER CLEARIFY (MISCELLANEOUS) ×3 IMPLANT
DERMABOND ADVANCED (GAUZE/BANDAGES/DRESSINGS) ×1
DERMABOND ADVANCED .7 DNX12 (GAUZE/BANDAGES/DRESSINGS) ×2 IMPLANT
DRAPE ARM DVNC X/XI (DISPOSABLE) ×8 IMPLANT
DRAPE COLUMN DVNC XI (DISPOSABLE) ×2 IMPLANT
DRAPE DA VINCI XI ARM (DISPOSABLE) ×4
DRAPE DA VINCI XI COLUMN (DISPOSABLE) ×1
DRAPE UTILITY XL STRL (DRAPES) ×3 IMPLANT
DURAPREP 26ML APPLICATOR (WOUND CARE) ×3 IMPLANT
ELECT REM PT RETURN 9FT ADLT (ELECTROSURGICAL) ×3
ELECTRODE REM PT RTRN 9FT ADLT (ELECTROSURGICAL) ×2 IMPLANT
GAUZE PETROLATUM 1 X8 (GAUZE/BANDAGES/DRESSINGS) ×3 IMPLANT
GLOVE BIO SURGEON STRL SZ 6.5 (GLOVE) ×9 IMPLANT
GLOVE BIO SURGEON STRL SZ8 (GLOVE) IMPLANT
GLOVE BIOGEL PI IND STRL 7.0 (GLOVE) ×10 IMPLANT
GLOVE BIOGEL PI IND STRL 8 (GLOVE) IMPLANT
GLOVE BIOGEL PI INDICATOR 7.0 (GLOVE) ×5
GLOVE BIOGEL PI INDICATOR 8 (GLOVE)
GOWN STRL REUS W/TWL XL LVL3 (GOWN DISPOSABLE) IMPLANT
HOLDER FOLEY CATH W/STRAP (MISCELLANEOUS) IMPLANT
IRRIGATION STRYKERFLOW (MISCELLANEOUS) ×2 IMPLANT
IRRIGATOR STRYKERFLOW (MISCELLANEOUS) ×3
LEGGING LITHOTOMY PAIR STRL (DRAPES) ×3 IMPLANT
MANIPULATOR VCARE STD CRV RETR (MISCELLANEOUS) ×3 IMPLANT
OBTURATOR OPTICAL STANDARD 8MM (TROCAR) ×1
OBTURATOR OPTICAL STND 8 DVNC (TROCAR) ×2
OBTURATOR OPTICALSTD 8 DVNC (TROCAR) ×2 IMPLANT
OCCLUDER COLPOPNEUMO (BALLOONS) ×3 IMPLANT
PACK ROBOT WH (CUSTOM PROCEDURE TRAY) ×3 IMPLANT
PACK ROBOTIC GOWN (GOWN DISPOSABLE) ×3 IMPLANT
PACK TRENDGUARD 450 HYBRID PRO (MISCELLANEOUS) IMPLANT
PAD OB MATERNITY 4.3X12.25 (PERSONAL CARE ITEMS) ×3 IMPLANT
PAD PREP 24X48 CUFFED NSTRL (MISCELLANEOUS) ×3 IMPLANT
POUCH ENDO CATCH II 15MM (MISCELLANEOUS) IMPLANT
PROTECTOR NERVE ULNAR (MISCELLANEOUS) ×6 IMPLANT
RTRCTR WOUND ALEXIS 18CM SML (INSTRUMENTS)
SAVER CELL AAL HAEMONETICS (INSTRUMENTS) IMPLANT
SEAL CANN UNIV 5-8 DVNC XI (MISCELLANEOUS) ×8 IMPLANT
SEAL XI 5MM-8MM UNIVERSAL (MISCELLANEOUS) ×4
SET IRRIG Y TYPE TUR BLADDER L (SET/KITS/TRAYS/PACK) IMPLANT
SET TRI-LUMEN FLTR TB AIRSEAL (TUBING) ×3 IMPLANT
SUT ETHIBOND 0 (SUTURE) IMPLANT
SUT VIC AB 4-0 PS2 27 (SUTURE) ×9 IMPLANT
SUT VICRYL 0 UR6 27IN ABS (SUTURE) ×3 IMPLANT
SUT VLOC 180 0 9IN  GS21 (SUTURE) ×1
SUT VLOC 180 0 9IN GS21 (SUTURE) ×2 IMPLANT
SUT VLOC 180 2-0 6IN GS21 (SUTURE) IMPLANT
TIP RUMI ORANGE 6.7MMX12CM (TIP) IMPLANT
TIP UTERINE 5.1X6CM LAV DISP (MISCELLANEOUS) IMPLANT
TIP UTERINE 6.7X10CM GRN DISP (MISCELLANEOUS) IMPLANT
TIP UTERINE 6.7X6CM WHT DISP (MISCELLANEOUS) IMPLANT
TIP UTERINE 6.7X8CM BLUE DISP (MISCELLANEOUS) IMPLANT
TOWEL OR 17X26 10 PK STRL BLUE (TOWEL DISPOSABLE) ×3 IMPLANT
TRENDGUARD 450 HYBRID PRO PACK (MISCELLANEOUS)
TROCAR PORT AIRSEAL 5X120 (TROCAR) ×3 IMPLANT
WATER STERILE IRR 1000ML POUR (IV SOLUTION) ×3 IMPLANT

## 2020-04-21 NOTE — Anesthesia Procedure Notes (Signed)
Arterial Line Insertion Start/End12/05/2019 8:00 AM, 04/21/2020 8:10 AM Performed by: Kyung Rudd, CRNA, CRNA  Patient location: Pre-op. Preanesthetic checklist: patient identified, IV checked, site marked, risks and benefits discussed, surgical consent, monitors and equipment checked, pre-op evaluation, timeout performed and anesthesia consent Lidocaine 1% used for infiltration Left, radial was placed Catheter size: 20 G Hand hygiene performed  and Seldinger technique used  Attempts: 3 Procedure performed without using ultrasound guided technique. Following insertion, dressing applied and Biopatch. Post procedure assessment: normal and unchanged  Patient tolerated the procedure well with no immediate complications.

## 2020-04-21 NOTE — Anesthesia Procedure Notes (Signed)
Procedure Name: Intubation Performed by: Vance Hochmuth H, CRNA Pre-anesthesia Checklist: Patient identified, Emergency Drugs available, Suction available and Patient being monitored Patient Re-evaluated:Patient Re-evaluated prior to induction Oxygen Delivery Method: Circle System Utilized Preoxygenation: Pre-oxygenation with 100% oxygen Induction Type: IV induction Ventilation: Mask ventilation without difficulty Laryngoscope Size: Miller and 2 Grade View: Grade I Tube type: Oral Tube size: 7.5 mm Number of attempts: 1 Airway Equipment and Method: Stylet and Oral airway Placement Confirmation: ETT inserted through vocal cords under direct vision,  positive ETCO2 and breath sounds checked- equal and bilateral Secured at: 22 cm Tube secured with: Tape Dental Injury: Teeth and Oropharynx as per pre-operative assessment        

## 2020-04-21 NOTE — Brief Op Note (Addendum)
04/21/2020  12:24 PM  PATIENT:  Kristen Shepherd  44 y.o. female  PRE-OPERATIVE DIAGNOSIS:  abnormal uterine bleeding refractory to conservative treatment  POST-OPERATIVE DIAGNOSIS:  abnormal uterine bleeding refractory to conservative treatment  PROCEDURE:  Procedure(s): XI ROBOTIC ASSISTED LAPAROSCOPIC TOTAL HYSTERECTOMY AND SALPINGECTOMY (Bilateral)  IUD REMOVAL (en bloc with uterus)  SURGEON:  Surgeon(s) and Role:    Joseph Pierini, MD - Primary    * Princess Bruins, MD - Assisting  ANESTHESIA:   local and general  EBL:  100 mL   BLOOD ADMINISTERED:none  DRAINS: Urinary Catheter (Foley)   LOCAL MEDICATIONS USED:  BUPIVICAINE   SPECIMEN:  Source of Specimen:  uterus, cervix, bilateral fallopian tubes  DISPOSITION OF SPECIMEN:  PATHOLOGY  COUNTS:  YES  TOURNIQUET:  * No tourniquets in log *  DICTATION: .Note written in EPIC  PLAN OF CARE: Discharge to home after PACU  PATIENT DISPOSITION:  PACU - hemodynamically stable.   Delay start of Pharmacological VTE agent (>24hrs) due to surgical blood loss or risk of bleeding: not applicable

## 2020-04-21 NOTE — H&P (Signed)
   Kristen Shepherd 1975/09/04 MRN: [id number]  HPI The patient is a 44 y.o. G0P0 who presents today forscheduled robotic assisted total laparoscopic hysterectomy, bilateral salpingectomy, IUD removal, cystoscopy for refractory abnormal uterine bleeding. Her procedure has been canceled/rescheduled twice now due to blood pressure concerns.  At the last attempt on 03/16/2020 her procedure was canceled again due to inability to monitor blood pressure after induction of anesthesia (see previous notes and anesthesia documentation).  She has had extensive preoperative monitoring since her last surgery cancellation with coordination of her primary care provider and pre-anesthesia evaluations.  No other changes to her medical history since her pre op exam, denies CP, SOB, fever/chills, dysuria.  Past Medical History:  Diagnosis Date  . Arthritis    knees, elbows, shoulders, ankles, wrists  . DUB (dysfunctional uterine bleeding) 6.22.12  . Endometrial polyp 6.22.12  . Headache   . Hypertension   . IUD 11/24/10   MIRENA INSERTED 11/24/10  . Vitamin D deficiency    Past Surgical History:  Procedure Laterality Date  . HYSTEROSCOPY  6.22.12   polyp and dub  . INTRAUTERINE DEVICE INSERTION  02/05/2019   MIRENA   Allergies  Allergen Reactions  . Meloxicam Other (See Comments)    Felt like having a heart attack   Tolerates ibuprofen fine  No current facility-administered medications on file prior to encounter.   Current Outpatient Medications on File Prior to Encounter  Medication Sig Dispense Refill  . acetaminophen (TYLENOL) 500 MG tablet Take 2 tablets (1,000 mg total) by mouth every 6 (six) hours as needed.    Marland Kitchen amLODipine (NORVASC) 10 MG tablet Take 10 mg by mouth daily.    . hydrochlorothiazide (HYDRODIURIL) 25 MG tablet Take 25 mg by mouth daily.     Marland Kitchen levonorgestrel (MIRENA) 20 MCG/24HR IUD 1 each by Intrauterine route once.     Marland Kitchen lisinopril (ZESTRIL) 20 MG tablet Take 20 mg by mouth  daily.     . Multiple Vitamin (MULTIVITAMIN WITH MINERALS) TABS tablet Take 1 tablet by mouth daily. One-A-Day for Women    . amLODipine (NORVASC) 5 MG tablet Take 1 tablet (5 mg total) by mouth daily. (Patient not taking: Reported on 04/12/2020) 30 tablet 1  . B Complex-C (SUPER B COMPLEX PO) Take 1 tablet by mouth daily. (Patient not taking: Reported on 04/12/2020)    . cholecalciferol (VITAMIN D3) 25 MCG (1000 UNIT) tablet Take 1,000 Units by mouth daily. (Patient not taking: Reported on 04/12/2020)        Physical Exam   BP (!) 163/87   Pulse (!) 109   Temp 98.8 F (37.1 C)   Resp 20   Ht 5\' 7"  (1.702 m)   Wt 131.2 kg   SpO2 99%   BMI 45.30 kg/m    General: Pleasant female, no acute distress, alert and oriented CV: RRR, no murmurs Pulm: good respiratory effort, CTAB     Plan Proceed with robotic assisted total laparoscopic hysterectomy, bilateral salpingectomy, cystoscopy as planned. Discussed post operative recovery expectations and possible same day discharge. All questions answered and the patient agrees to proceed.    Joseph Pierini, MD 04/21/20

## 2020-04-21 NOTE — OR Nursing (Signed)
Pt is awake,alert and oriented.Pt and/or family verbalized understanding of poc and discharge instructions. Reviewed admission and on going care with receiving RN. Pt is in NAD at this time and is ready to be transferred to floor. Will con't to monitor until pt is transferred. Belongings on bed with patient  

## 2020-04-21 NOTE — Transfer of Care (Signed)
Immediate Anesthesia Transfer of Care Note  Patient: [name]  Procedure(s) Performed: XI ROBOTIC ASSISTED LAPAROSCOPIC TOTAL HYSTERECTOMY AND SALPINGECTOMY (Bilateral Abdomen)  Patient Location: PACU  Anesthesia Type:General  Level of Consciousness: awake, alert  and oriented  Airway & Oxygen Therapy: Patient Spontanous Breathing and Patient connected to nasal cannula oxygen  Post-op Assessment: Report given to RN, Post -op Vital signs reviewed and stable and Patient moving all extremities X 4  Post vital signs: Reviewed and stable  Last Vitals:  Vitals Value Taken Time  BP 148/78 04/21/20 1226  Temp    Pulse 108 04/21/20 1228  Resp 21 04/21/20 1228  SpO2 100 % 04/21/20 1228  Vitals shown include unvalidated device data.  Last Pain:  Vitals:   04/21/20 0657  PainSc: 0-No pain         Complications: No complications documented.

## 2020-04-22 ENCOUNTER — Encounter (HOSPITAL_COMMUNITY): Payer: Self-pay | Admitting: Obstetrics and Gynecology

## 2020-04-22 DIAGNOSIS — N939 Abnormal uterine and vaginal bleeding, unspecified: Secondary | ICD-10-CM | POA: Diagnosis not present

## 2020-04-22 LAB — SURGICAL PATHOLOGY

## 2020-04-22 NOTE — Anesthesia Postprocedure Evaluation (Signed)
Anesthesia Post Note  Patient: Kristen Shepherd  Procedure(s) Performed: XI ROBOTIC ASSISTED LAPAROSCOPIC TOTAL HYSTERECTOMY AND SALPINGECTOMY (Bilateral Abdomen)     Patient location during evaluation: PACU Anesthesia Type: General Level of consciousness: awake and alert Pain management: pain level controlled Vital Signs Assessment: post-procedure vital signs reviewed and stable Respiratory status: spontaneous breathing, nonlabored ventilation, respiratory function stable and patient connected to nasal cannula oxygen Cardiovascular status: blood pressure returned to baseline and stable Postop Assessment: no apparent nausea or vomiting Anesthetic complications: no   No complications documented.  Last Vitals:  Vitals:   04/22/20 1008 04/22/20 1413  BP: (!) 144/79 138/78  Pulse: 93 94  Resp: 18 19  Temp: 37.1 C 36.7 C  SpO2: 97% 100%    Last Pain:  Vitals:   04/22/20 1413  TempSrc: Oral  PainSc:                  [name]

## 2020-04-22 NOTE — Plan of Care (Signed)

## 2020-04-22 NOTE — Discharge Summary (Signed)
DCSUMGYN Name: Kristen Shepherd  Age: 44 y.o.  Date of Birth: [birth year]  Medical Record #: [id number]   Discharge Summary  DISCHARGE DIAGNOSIS: 1. Abnormal uterine bleeding refractory to conservative treatment 2. IUD in place 3. S/p XI ROBOTIC ASSISTED LAPAROSCOPIC TOTAL HYSTERECTOMY AND BILATERAL SALPINGECTOMY, IUD REMOVAL  ADMISSION DATE: 04/21/2020 DISMISSAL DATE: 04/22/2020  CONSULTS: NONE LENGTH OF STAY: 1 DAYS  Kristen Shepherd was admitted for the above surgery and had no intraoperative complications.  Her postoperative course was uneventful and she is tolerating a general diet without restriction on discharge.  Pain is currently controlled with oral medications and the patient is voiding and ambulating well prior to discharge.  Vital signs: BP (!) 144/79 (BP Location: Right Arm)   Pulse 93   Temp 98.7 F (37.1 C)   Resp 18   Ht 5\' 7"  (1.702 m)   Wt 131.2 kg   LMP 02/03/2019   SpO2 97%   BMI 45.30 kg/m   Patient is afebrile and bp in her usual range at discharge. Abdomen is nondistended with positive bowel sounds.  Lungs are clear to auscultation bilaterally.  Incisions across upper abdomen are clean, dry and intact x5. No signs of erythema nor exudate. Labs are within expected limits at discharge.  Discharge medications include Ibuprofen 600 mg to be taken every six hours as needed for pain, oxycodone 5 mg to be taken every four to six hours as needed for moderate to severe pain, and appropriate use of stool softeners to avoid postoperative constipation. A common recommendation is to use Colace 100 mg by mouth two times a day as needed (hold Colace for loose stools). Potential adverse reactions to current medications were discussed.  Current Outpatient Medications  Medication Instructions  . acetaminophen (TYLENOL) 1,000 mg, Oral, Every 6 hours, Then as needed for pain  . amLODipine (NORVASC) 5 mg, Oral, Daily  . amLODipine (NORVASC) 10 mg, Oral, Daily  . B Complex-C  (SUPER B COMPLEX PO) 1 tablet, Daily  . cholecalciferol (VITAMIN D3) 1,000 Units, Daily  . docusate sodium (COLACE) 100 mg, Oral, 2 times daily PRN  . hydrochlorothiazide (HYDRODIURIL) 25 mg, Oral, Daily  . ibuprofen (MOTRIN IB) 600 mg, Oral, Every 6 hours, Take with food. Then as needed for pain.  Marland Kitchen levonorgestrel (MIRENA) 20 MCG/24HR IUD 1 each, Intrauterine,  Once  . lisinopril (ZESTRIL) 20 mg, Oral, Daily  . Multiple Vitamin (MULTIVITAMIN WITH MINERALS) TABS tablet 1 tablet, Oral, Daily, One-A-Day for Women  . oxyCODONE (ROXICODONE) 5 mg, Oral, Every 6 hours PRN    Patients may often be constipated after surgery due to prolonged periods of bedrest and use of oral narcotic analgesics. If there is no bowel movement for 3 days after surgery, or if the patient is uncomfortable and unable to pass stool, she will try one or all of the following measures: 1.  Milk of Magnesia, 30 cc by mouth every 12 hours  2.  Dulcolax suppository, one suppository per rectum every 6 hours 3.  Metamucil, Fibercon or other bulk former, used as directed 4.  Fleets Enema 5.  Prunes or Prune Juice 6.  Miralax The patient is instructed to contact the Ob/Gyn clinic if these measures are unsuccessful or accompanied by severe abdominal pain, nausea and emesis, or fever.  Emergency Medical Treatment and Active Labor Act (EMTALA):  Patient has no emergency condition and is stable for discharge.  Condition at discharge is independent but with activity restrictions of no lifting >20 lbs., no  driving while on narcotic pain medications, and nothing per vagina for six to eight weeks.  Keep any incisions clean and dry by running soapy water over them and dabbing them dry. It is best to shower for the first week while the healing process is underway; after one week it is safe to take a bath.   Patient is to refrain from placing anything in the vagina within the next 6-8 weeks.  This pelvic rest includes intercourse, douching,  and tampons.  The patient will call the [facility] for problems such as fever with chills, nausea, vomiting, constipation, heavy odorous vaginal discharge, burning, or pain associated with urination.  Also call for excessive vaginal bleeding, saturating more than one pad an hour for more than three consecutive hours.  Follow-up in the [facility] in 2 weeks for an incision check and in 6 weeks for a complete postoperative visit.  She is advised to return to clinic earlier if signs of fever, increasing pain, or other postoperative concerns arise.     Joseph Pierini, M.D. 04/22/20

## 2020-04-22 NOTE — Progress Notes (Signed)
Nsg Discharge Note  Admit Date:  04/21/2020 Discharge date: 04/22/2020   [name] to be D/C'd home per MD order.  AVS completed. Patient/caregiver able to verbalize understanding.  Discharge Medication: Allergies as of 04/22/2020      Reactions   Meloxicam Other (See Comments)   Felt like having a heart attack      Medication List    STOP taking these medications   levonorgestrel 20 MCG/24HR IUD Commonly known as: MIRENA     TAKE these medications   acetaminophen 500 MG tablet Commonly known as: TYLENOL Take 2 tablets (1,000 mg total) by mouth every 6 (six) hours for 5 days. Then as needed for pain What changed:   when to take this  reasons to take this  additional instructions   amLODipine 10 MG tablet Commonly known as: NORVASC Take 10 mg by mouth daily. What changed: Another medication with the same name was removed. Continue taking this medication, and follow the directions you see here.   cholecalciferol 25 MCG (1000 UNIT) tablet Commonly known as: VITAMIN D3 Take 1,000 Units by mouth daily.   docusate sodium 100 MG capsule Commonly known as: Colace Take 1 capsule (100 mg total) by mouth 2 (two) times daily as needed (constipation).   hydrochlorothiazide 25 MG tablet Commonly known as: HYDRODIURIL Take 25 mg by mouth daily.   ibuprofen 200 MG tablet Commonly known as: Motrin IB Take 3 tablets (600 mg total) by mouth every 6 (six) hours for 5 days. Take with food. Then as needed for pain.   lisinopril 20 MG tablet Commonly known as: ZESTRIL Take 20 mg by mouth daily.   multivitamin with minerals Tabs tablet Take 1 tablet by mouth daily. One-A-Day for Women   oxyCODONE 5 MG immediate release tablet Commonly known as: Roxicodone Take 1 tablet (5 mg total) by mouth every 6 (six) hours as needed for breakthrough pain.   SUPER B COMPLEX PO Take 1 tablet by mouth daily.       Discharge Assessment: Vitals:   04/22/20 1008 04/22/20 1413  BP:  (!) 144/79 138/78  Pulse: 93 94  Resp: 18 19  Temp: 98.7 F (37.1 C) 98.1 F (36.7 C)  SpO2: 97% 100%   Skin clean, dry and intact without evidence of skin break down, no evidence of skin tears noted. IV catheter discontinued intact. Site without signs and symptoms of complications - no redness or edema noted at insertion site, patient denies c/o pain - only slight tenderness at site.  Dressing with slight pressure applied.  D/c Instructions-Education: Discharge instructions given to patient/family with verbalized understanding. D/c education completed with patient/family including follow up instructions, medication list, d/c activities limitations if indicated, with other d/c instructions as indicated by MD - patient able to verbalize understanding, all questions fully answered. Patient instructed to return to ED, call 911, or call MD for any changes in condition.  Patient escorted via [place], and D/C home via private auto.  Atilano Ina, RN 04/22/2020 3:20 PM

## 2020-04-22 NOTE — Plan of Care (Signed)

## 2020-04-26 NOTE — Op Note (Signed)
Name: Kristen Shepherd  Age: 44 y.o.  Date of Birth: Oct 09, 1975  Medical Record #: [id number]  Operative Note  Preoperative Diagnosis: Abnormal uterine bleeding refractory to conservative treatment Procedure: Da Vinci XI robotic assisted total laparoscopic hysterectomy, bilateral salpingectomy, IUD removal (en bloc with uterus) Postoperative Diagnosis: same Surgeon: Joseph Pierini, MD  Assistant: Princess Bruins, MD Estimated Blood Loss: 100 mL Anesthesia: General, local with 0.25% bupivacaine with epinephrine Findings: Normal sized uterus, normal bilateral fallopian tubes, normal appearing bilateral ovaries, left ovary with 3 to 4 cm simple cyst that ruptured during the course of the surgery with release of clear straw-colored fluid contents.  No significant intraabdominal adhesions. Complications: None. Date: 04/21/20  Under general anesthesia with endotracheal intubation Kristen Shepherd was placed in dorsolithotomy position with yellowfin stirrups.  She was prepped as usual with DuraPrep on the abdomen and with Betadine on the suprapubic, vulvar and vaginal areas.   The Foley was inserted in the bladder.  A surgical team time out was performed to verify and agree on procedure and patient consent. Three grams of IV cefazolin were administered prior to the beginning of the procedure.  The weighted speculum was inserted in the vagina and the anterior lip of the cervix is grasped with a tenaculum.    The IUD string was not visible nor retrievable.  Dilation of the cervix was accomplished with with Kennon Rounds dilators.  The uterus sounded to 8 cm.  The medium cup sized V-care device was inserted into the uterine cavity and the intrauterine balloon was inflated with 5 mL of air.  The cervical cup was placed and snugged closely up to the cervix and carefully checked to ensure that no vaginal tissue encroached the edges of the cup.  The other instruments are removed and the vaginal area.    Attention was  directed to the abdomen.  The supraumbilical area was infiltrated with bupivacaine 0.25% bupivacaine.  A 1.5 cm incision was made at that level with the scalpel.  The aponeurosis was sharply opened with Mayo scissors under direct vision after applying the Kocher clamps to this layer for traction.  The parietal peritoneum was digitally opened bluntly.  A pursestring stitch of #0-Vicryl is placed on the aponeurosis.  The Hasson trocar was inserted and insufflation is started, achieving pneumoperitoneum of 15 mmHg.  The laparoscope was inserted.  The abdominal wall was free of adhesions where the other ports were to be inserted.  Intra-abdominal findings were noted as above.  The camera was removed.  Infiltration of bupivacaine 0.25% was applied at all four other port sites which were marked on the skin and measured approximately 8 cm from each other.  Small incisions were made with the scalpel at those levels.  Insertion of trocars was performed under direct vision with 2 robotic ports on the right in line with the umbilicus and 1 robotic port on the distal left in line with the umbilicus.  The assistant port was inserted medially into the left the umbilicus.  The table was positioned in 30 degree Trendelenburg for robot docking.  The patient tolerated that position well.  The robot was docked from the right side of the patient.  Targeting was performed.  The other arms were docked.  Robotic instruments were inserted under direct vision with the fenestrated clamp in the fourth arm, the scissors in the third arm and the long tipped bipolar device in the first arm.    At the [employer] console, further inspection of the  pelvis revealed both ureters in normal anatomic position with good vermiculation.  The right mesosalpinx was fulgurated with bipolar energy and transected with the Endo scissors.  The fallopian tube was left attached to the uterus.  The right utero-ovarian ligament was fulgurated and transected.  The  right round ligament was fulgurated and transected.  The anterior visceral peritoneum was opened.  This sequence was repeated on the left side of the uterus, and the fallopian tube was left attached to the uterus.  The left utero-ovarian ligament was fulgurated and transected.  The visceral peritoneum was opened anteriorly to create a bladder flap and the bladder was gently pushed inferiorly beyond the cervical cup.  The uterine arteries were fulgurated and transected first on the right and then on the left side.  The vaginal occluder was inflated by the assistant.  The colpotomy was started as the upper aspect of the vagina was then opened with the tip of the scissors using monopolar energy on cut mode over the cervical cup anteriorly, then down the sides and posteriorly to completely detach the uterus with the cervix and both tubes.  The specimen was easily delivered vaginally.    The IUD was able to be removed from the uterus and a picture was taken of the uterus and fallopian tube specimen and the IUD removed from the uterus.  The V-care and specimen were removed by the assistant and the vaginal occluder was replaced in the vagina.  Adequate hemostasis was verified at all levels.  Both ureters were seen intact with continued vermiculation.  Urine in the Foley bag remained clear.  The instruments were changed to the cutting needle driver in the third arm and the long tip clamp in the first arm.  A #0 V-Loc 9 inch suture was used to close the vaginal vault.  The first stitch was placed at the right vaginal apex and a running suture was completed all the way to the left vaginal apex and then the remainder of the suture was used to reinforce the vaginal cuff with a second closure layer.  The suture was cut and the needle was removed from the abdomen.  The robotic instruments were removed under direct vision.  The robot was undocked.  Laparoscope was reinserted into the abdomen and the area was irrigated and  suctioned.  Excellent hemostasis was noted at all pedicles.   All laparoscopic instruments were removed.  The pneumoperitoneum was evacuated.  The supraumbilical incision was closed by pulling the pursestring stitch closed at the aponeurosis.  Palpation after closure indicated no fascial defect.  All incisions were closed at the skin with a subcuticular suture of #4-0 Vicryl.  Dermabond was applied over the top of the incisions. The vaginal occluder was removed from the vagina.    Sponge, instrument, needle counts were correct x2. The patient tolerated the procedure well and was brought to the recovery room in stable condition.  The surgical assist was present for the full case and was needed to assist with retraction, visualization, and wound closure.   Joseph Pierini, M.D., Cherlynn June

## 2020-05-06 ENCOUNTER — Encounter: Payer: Self-pay | Admitting: Obstetrics and Gynecology

## 2020-05-06 ENCOUNTER — Ambulatory Visit (INDEPENDENT_AMBULATORY_CARE_PROVIDER_SITE_OTHER): Payer: 59 | Admitting: Obstetrics and Gynecology

## 2020-05-06 ENCOUNTER — Other Ambulatory Visit: Payer: Self-pay

## 2020-05-06 VITALS — BP 122/82

## 2020-05-06 DIAGNOSIS — Z90710 Acquired absence of both cervix and uterus: Secondary | ICD-10-CM

## 2020-05-06 DIAGNOSIS — Z9889 Other specified postprocedural states: Secondary | ICD-10-CM

## 2020-05-06 NOTE — Progress Notes (Signed)
   BOBBE QUILTER 05/16/1976 [id number]  [place] Chief Complaint  Patient presents with  . Post Op check     HISTORY OF PRESENT ILLNESS KARLE DESROSIER presents for routine 2 week post-operative follow up for XI robotic assisted total laparoscopic hysterectomy and bilateral salpingectomy with IUD removal performed on 04/21/2020 for abnormal uterine bleeding refractory to conservative treatments. The patient is doing well with no concerns.  She denies vaginal discharge, occasional light vaginal spotting but no heavy bleeding. She is tolerating a normal diet.  Bowel and bladder function are normal. Pain is minimal and controlled with occasional use of OTC medications. Pathology report on the specimen was benign.  OBJECTIVE  BP 122/82   LMP 02/03/2019    PHYSICAL EXAM  General:  Patient is no acute distress she is alert and oriented Abdomen:  Soft, nontender, nondistended.  No mass, rebound, nor guarding is appreciated.  Incisions are clean dry and intact x5 across the abdomen at the level of the umbilicus and without drainage nor erythema.   ASSESSMENT / PLAN  Routine 2 week post operative check.   The patient is doing well and meeting all postoperative milestones.  I reviewed the benign pathology report from hysterectomy specimen.  I showed her some pictures taken during the laparoscopy. She will continue to follow postoperative restrictions. Recovery considerations and expectations are reviewed. RTC 1 month for final postoperative visit or sooner as needed.   Joseph Pierini MD 05/06/20

## 2020-06-04 ENCOUNTER — Encounter: Payer: Self-pay | Admitting: Obstetrics and Gynecology

## 2020-06-04 ENCOUNTER — Other Ambulatory Visit: Payer: Self-pay

## 2020-06-04 ENCOUNTER — Ambulatory Visit (INDEPENDENT_AMBULATORY_CARE_PROVIDER_SITE_OTHER): Payer: 59 | Admitting: Obstetrics and Gynecology

## 2020-06-04 VITALS — BP 132/80

## 2020-06-04 DIAGNOSIS — N898 Other specified noninflammatory disorders of vagina: Secondary | ICD-10-CM

## 2020-06-04 DIAGNOSIS — Z90710 Acquired absence of both cervix and uterus: Secondary | ICD-10-CM

## 2020-06-04 NOTE — Progress Notes (Signed)
   Kristen Shepherd [birth year] [id number]  [place] Chief Complaint  Patient presents with  . Routine Post Op    XI ROBOTIC ASSISTED LAPAROSCOPIC TOTAL HYSTERECTOMY AND SALPINGECTOMY (Bilateral Abdomen)     HISTORY OF PRESENT ILLNESS Kristen Shepherd presents for routine 6 week post-operative follow up for XI robotic assisted total laparoscopic hysterectomy and bilateral salpingectomy with IUD removal performed on 04/21/2020 for abnormal uterine bleeding refractory to conservative treatments. The patient is doing well overall with no major concerns.  Occasional soreness across the upper abdomen but not needing any pain medicines.  Pinkish tinged vaginal discharge stopped about 5 to 6 days ago, no vaginal bleeding since that time. She is tolerating normal diet.  Bowel and bladder function are normal.  Pathology report from the surgery indictated benign tissues  OBJECTIVE  BP 132/80 (BP Location: Right Arm, Patient Position: Sitting, Cuff Size: Large)   LMP 02/03/2019    PHYSICAL EXAM General:  Patient is no acute distress she is alert and oriented Abdomen:  Soft, nontender, nondistended.  No mass, rebound, nor guarding is appreciated.  Incisions are well-healed, clean, dry and intact x5 across the abdomen at the level of the umbilicus and without drainage nor erythema. Pelvic:  External genitalia appear to be within normal limits consistent with postmenopausal status.  Bartholin's and Skene glands and urethra appear to be within normal limits.  There are no lesions on the labia or vaginal tissues.  Vaginal epithelium is moist, pink, with expected loss of rugation.  There is no vaginal bleeding or abnormal discharge. Vaginal cuff appears intact without visible suture line but there is a 1 cm patch of granulation tissue which is touched with silver nitrate sticks x2.  Bimanual exam indicates that the vaginal cuff is intact and without tenderness.  There is no adnexal  mass, fullness, nor tenderness. Wandra Scot Bonham present for exam   ASSESSMENT / PLAN  Routine 6 week post operative check.   The patient is doing well overall and meeting all postoperative milestones.  I reassured her that she will continue to feel better over time in the next several weeks as it does take a while to fully heal from a major surgery such as hysterectomy.  She is recovering as about expected.  Reviewed the finding of granulation tissue at the vaginal cuff and that she might have a little bit of burning discomfort from the silver nitrate treatment today, I would like her to return in 1 to 2 weeks for another evaluation of the healing of the vaginal cuff.  She otherwise may slowly/gradually return to normal activities without restriction, would wait on resuming intercourse for another 2 weeks.   Joseph Pierini MD 06/04/20

## 2020-06-17 ENCOUNTER — Encounter: Payer: Self-pay | Admitting: Podiatry

## 2020-06-17 ENCOUNTER — Other Ambulatory Visit: Payer: Self-pay

## 2020-06-17 ENCOUNTER — Ambulatory Visit (INDEPENDENT_AMBULATORY_CARE_PROVIDER_SITE_OTHER): Payer: 59 | Admitting: Podiatry

## 2020-06-17 DIAGNOSIS — M76821 Posterior tibial tendinitis, right leg: Secondary | ICD-10-CM | POA: Diagnosis not present

## 2020-06-18 ENCOUNTER — Encounter: Payer: Self-pay | Admitting: Obstetrics and Gynecology

## 2020-06-18 ENCOUNTER — Ambulatory Visit (INDEPENDENT_AMBULATORY_CARE_PROVIDER_SITE_OTHER): Payer: 59 | Admitting: Obstetrics and Gynecology

## 2020-06-18 ENCOUNTER — Encounter: Payer: Self-pay | Admitting: Podiatry

## 2020-06-18 VITALS — BP 110/78 | HR 74 | Resp 18

## 2020-06-18 DIAGNOSIS — N898 Other specified noninflammatory disorders of vagina: Secondary | ICD-10-CM

## 2020-06-18 DIAGNOSIS — Z90710 Acquired absence of both cervix and uterus: Secondary | ICD-10-CM

## 2020-06-18 NOTE — Progress Notes (Signed)
   SHAWNTINA DIFFEE 1975-07-14 [id number]  [place] Chief Complaint  Patient presents with  . Post-op Follow-up     HISTORY OF PRESENT ILLNESS CRISTOL ENGDAHL presents for post-operative follow up 8 weeks after an XI robotic assisted total laparoscopic hysterectomy and bilateral salpingectomy with IUD removal performed on 04/21/2020 for abnormal uterine bleeding refractory to conservative treatments. The patient is doing well overall with no major concerns. At her 6-week postoperative visit she was noted to have granulation tissue at the vaginal cuff which was treated with silver nitrate cautery. She has not had any vaginal bleeding since prior to that appointment. No other concerns and she feels healing is going well at this point.  OBJECTIVE  BP 110/78 (BP Location: Right Arm, Patient Position: Sitting)   Pulse 74   Resp 18   LMP 02/03/2019    PHYSICAL EXAM General:  Patient is no acute distress she is alert and oriented Abdomen:  Soft, nontender, nondistended.  No mass, rebound, nor guarding is appreciated.  Incisions are well-healed, clean, dry and intact x5 across the abdomen at the level of the umbilicus and without drainage nor erythema. Pelvic:  Vaginal cuff appears intact without visible suture line. Previously seen granulation tissue patch is greatly diminished, now only approximately 5 mm in diameter. Silver nitrate applied x1 to the area.  Bimanual exam indicates that the vaginal cuff is intact and without tenderness.  There is no adnexal mass, fullness, nor tenderness. Glorianne Manchester RN present for exam   ASSESSMENT / PLAN  Post operative check up follow-up granulation tissue.   The patient is doing well overall and meeting all postoperative milestones.  Vaginal cuff appears to be well-healed overall with just a tiny patch of granulation tissue remaining which was treated today.  Reviewed the finding of granulation tissue at the vaginal cuff and that  she might have a little bit of burning discomfort from the silver nitrate treatment today.  At this point she may slowly/gradually return to normal activities without restriction, and unless she encounters any vaginal bleeding or other concerns I do not think any further postoperative exams are necessary. She is released to routine gynecologic care. I did provide her with a note to return to work without restrictions this coming Monday.   Joseph Pierini MD 06/18/20

## 2020-06-18 NOTE — Progress Notes (Signed)
Subjective:  Patient ID: Kristen Shepherd, female    DOB: March 14, 1976,  MRN: [id number]  Chief Complaint  Patient presents with  . Foot Pain    "Im having worse pain than the last visit and Im starting to have numbness in my arch area"    45 y.o. female presents with the above complaint.  Patient presents with a follow-up of right posterior tibial tendinitis secondary pes planovalgus.  She states the pain is about the same.  It has not gotten better eventually got slightly worse.  She would like to discuss further treatment options including getting orthotics and MRI.   Review of Systems: Negative except as noted in the HPI. Denies N/V/F/Ch.  Past Medical History:  Diagnosis Date  . Arthritis    knees, elbows, shoulders, ankles, wrists  . DUB (dysfunctional uterine bleeding) 6.22.12  . Endometrial polyp 6.22.12  . Headache   . Hypertension   . IUD 11/24/10   MIRENA INSERTED 11/24/10  . Vitamin D deficiency     Current Outpatient Medications:  .  amLODipine (NORVASC) 10 MG tablet, Take 10 mg by mouth daily., Disp: , Rfl:  .  B Complex-C (SUPER B COMPLEX PO), Take 1 tablet by mouth daily., Disp: , Rfl:  .  cholecalciferol (VITAMIN D3) 25 MCG (1000 UNIT) tablet, Take 1,000 Units by mouth daily., Disp: , Rfl:  .  docusate sodium (COLACE) 100 MG capsule, Take 1 capsule (100 mg total) by mouth 2 (two) times daily as needed (constipation)., Disp: , Rfl:  .  hydrochlorothiazide (HYDRODIURIL) 25 MG tablet, Take 25 mg by mouth daily. , Disp: , Rfl:  .  lisinopril (ZESTRIL) 20 MG tablet, Take 20 mg by mouth daily. , Disp: , Rfl:  .  Multiple Vitamin (MULTIVITAMIN WITH MINERALS) TABS tablet, Take 1 tablet by mouth daily. One-A-Day for Women, Disp: , Rfl:   Social History   Tobacco Use  Smoking Status Never Smoker  Smokeless Tobacco Never Used    Allergies  Allergen Reactions  . Meloxicam Other (See Comments)    Felt like having a heart attack    Objective:  There were no vitals  filed for this visit. There is no height or weight on file to calculate BMI. Constitutional Well developed. Well nourished.  Vascular Dorsalis pedis pulses palpable bilaterally. Posterior tibial pulses palpable bilaterally. Capillary refill normal to all digits.  No cyanosis or clubbing noted. Pedal hair growth normal.  Neurologic Normal speech. Oriented to person, place, and time. Epicritic sensation to light touch grossly present bilaterally.  Dermatologic Nails well groomed and normal in appearance. No open wounds. No skin lesions.  Orthopedic:  Pain on palpation along the course of the posterior tibial tendon including the insertion.  Pain with the resisted dorsiflexion inversion of the foot.  No pain with dorsiflexion eversion of the foot active and passive.  No pain at the Achilles tendon at the peroneal tendon at the ATFL ligament.  Gait examination shows severe pes planovalgus deformity that is rigid in nature.  Calcaneovalgus noted with too many toe signs unable to recruit the arch with dorsiflexion of the hallux.  Patient is unable to do single and double heel raises.   Radiographs: 3 views of skeletally mature adult right foot: Mild bunion deformity noted.  There is decreasing calcaneal inclination angle increase in talar declination angle.  Anterior break in the cyma line.  Findings consistent with pes planovalgus deformity.  Moderate arthrosis noted to the midfoot.  Assessment:   1. Posterior  tibial tendinitis of right leg    Plan:  Patient was evaluated and treated and all questions answered.  Right posterior tibial tendinitis with underlying pes planovalgus -Clinically her pain has regressed and she is having a lot of pain along the course of the posterior tibial tendon now with a component of tarsal tunnel syndrome.  The driving force for all of that is pes planovalgus.  She just received her orthotics have instructed her to continue wearing them.  In the meantime given  that her pain is worsening there might be a component of tearing involving the posterior tibial tendon that may need surgical intervention I believe she will benefit from an MRI evaluation of the right foot.  Patient agrees with the plan and would like to proceed with the MRI -Should be scheduled get an MRI of the right foot  Pes planovalgus rigid -I explained patient the etiology of pes planovalgus and various treatment options were discussed especially in the setting of posterior tibial tendinitis.  I believe patient will benefit from custom-made orthotics to help support the minimal arch that she has and control the hindfoot motion therefore take the stress off of the posterior tibial tendon.  Patient agrees with the plan would like to proceed with obtaining orthotics as she is tends to wear elastic toe boots as well at work. -Orthotics were dispensed today.  No acute complaints.  I instructed her about the break-in of the orthotics and.  She states understanding.  No follow-ups on file.

## 2020-06-29 ENCOUNTER — Ambulatory Visit
Admission: RE | Admit: 2020-06-29 | Discharge: 2020-06-29 | Disposition: A | Discharge status: Home / Self Care | Payer: 59 | Source: Ambulatory Visit | Attending: Podiatry | Admitting: Podiatry

## 2020-06-29 DIAGNOSIS — M76821 Posterior tibial tendinitis, right leg: Secondary | ICD-10-CM

## 2020-07-01 ENCOUNTER — Telehealth: Payer: Self-pay | Admitting: Podiatry

## 2020-07-01 NOTE — Telephone Encounter (Signed)
Called pt to schedule an MRI follow up with DR. Posey Pronto

## 2020-07-07 ENCOUNTER — Encounter: Payer: 59 | Admitting: Orthotics

## 2020-07-08 ENCOUNTER — Other Ambulatory Visit: Payer: Self-pay

## 2020-07-08 ENCOUNTER — Ambulatory Visit (INDEPENDENT_AMBULATORY_CARE_PROVIDER_SITE_OTHER): Payer: 59 | Admitting: Podiatry

## 2020-07-08 ENCOUNTER — Encounter: Payer: Self-pay | Admitting: Podiatry

## 2020-07-08 DIAGNOSIS — M76821 Posterior tibial tendinitis, right leg: Secondary | ICD-10-CM

## 2020-07-09 ENCOUNTER — Encounter: Payer: Self-pay | Admitting: Podiatry

## 2020-07-09 NOTE — Progress Notes (Signed)
Subjective:  Patient ID: Kristen Shepherd, female    DOB: 1975-08-20,  MRN: [id number]  Chief Complaint  Patient presents with  . Foot Pain    "my foot is not better, may be a little worse and now I have a small knot on top of my foot.  Its been there for a couple of weeks."   She is here to discuss MRI resutls    45 y.o. female presents with the above complaint.  Patient presents with a follow-up of right posterior tibial tendinitis secondary pes planovalgus.  She states the pain is about the same.  She got her MRIs and is here to go over the result.  She is wearing her orthotics as well.   Review of Systems: Negative except as noted in the HPI. Denies N/V/F/Ch.  Past Medical History:  Diagnosis Date  . Arthritis    knees, elbows, shoulders, ankles, wrists  . DUB (dysfunctional uterine bleeding) 6.22.12  . Endometrial polyp 6.22.12  . Headache   . Hypertension   . IUD 11/24/10   MIRENA INSERTED 11/24/10  . Vitamin D deficiency     Current Outpatient Medications:  .  amLODipine (NORVASC) 10 MG tablet, Take 10 mg by mouth daily., Disp: , Rfl:  .  B Complex-C (SUPER B COMPLEX PO), Take 1 tablet by mouth daily., Disp: , Rfl:  .  cholecalciferol (VITAMIN D3) 25 MCG (1000 UNIT) tablet, Take 1,000 Units by mouth daily., Disp: , Rfl:  .  docusate sodium (COLACE) 100 MG capsule, Take 1 capsule (100 mg total) by mouth 2 (two) times daily as needed (constipation)., Disp: , Rfl:  .  hydrochlorothiazide (HYDRODIURIL) 25 MG tablet, Take 25 mg by mouth daily. , Disp: , Rfl:  .  lisinopril (ZESTRIL) 20 MG tablet, Take 20 mg by mouth daily. , Disp: , Rfl:  .  Multiple Vitamin (MULTIVITAMIN WITH MINERALS) TABS tablet, Take 1 tablet by mouth daily. One-A-Day for Women, Disp: , Rfl:   Social History   Tobacco Use  Smoking Status Never Smoker  Smokeless Tobacco Never Used    Allergies  Allergen Reactions  . Meloxicam Other (See Comments)    Felt like having a heart attack    Objective:   There were no vitals filed for this visit. There is no height or weight on file to calculate BMI. Constitutional Well developed. Well nourished.  Vascular Dorsalis pedis pulses palpable bilaterally. Posterior tibial pulses palpable bilaterally. Capillary refill normal to all digits.  No cyanosis or clubbing noted. Pedal hair growth normal.  Neurologic Normal speech. Oriented to person, place, and time. Epicritic sensation to light touch grossly present bilaterally.  Dermatologic Nails well groomed and normal in appearance. No open wounds. No skin lesions.  Orthopedic:  Pain on palpation along the course of the posterior tibial tendon including the insertion.  Pain with the resisted dorsiflexion inversion of the foot.  No pain with dorsiflexion eversion of the foot active and passive.  No pain at the Achilles tendon at the peroneal tendon at the ATFL ligament.  Gait examination shows severe pes planovalgus deformity that is rigid in nature.  Calcaneovalgus noted with too many toe signs unable to recruit the arch with dorsiflexion of the hallux.  Patient is unable to do single and double heel raises.   Radiographs: 3 views of skeletally mature adult right foot: Mild bunion deformity noted.  There is decreasing calcaneal inclination angle increase in talar declination angle.  Anterior break in the cyma line.  Findings consistent with pes planovalgus deformity.  Moderate arthrosis noted to the midfoot.   IMPRESSION: Mild posterior tibialis tenosynovitis  Within the area of the tarsal tunnel at the level of the hindfoot possible small varicosities present. No soft tissue mass is seen.  Edema seen within the sinus tarsi. Assessment:   1. Posterior tibial tendinitis of right leg    Plan:  Patient was evaluated and treated and all questions answered.  Right posterior tibial tendinitis with underlying pes planovalgus -Clinically her pain has regressed and she is having a lot of pain  along the course of the posterior tibial tendon now with a component of tarsal tunnel syndrome.  The driving force for all of that is pes planovalgus.  She just received her orthotics have instructed her to continue wearing them.   -MRI was reviewed with the patient in extensive detail.  MRI was positive for tenosynovitis of the posterior tibial tendon.  I did describe for her that even if it is mild in nature and the MRI tends to be a lot worse that what it may seem like on the MRI.  I discussed my surgical plan in extensive detail.  For now patient would like to think about the surgery and will talk to family and we can discuss it in few weeks.  Patient states understanding. -Continue utilizing cam boot until then.  Pes planovalgus rigid -I explained patient the etiology of pes planovalgus and various treatment options were discussed especially in the setting of posterior tibial tendinitis.  I believe patient will benefit from custom-made orthotics to help support the minimal arch that she has and control the hindfoot motion therefore take the stress off of the posterior tibial tendon.  Patient agrees with the plan would like to proceed with obtaining orthotics as she is tends to wear elastic toe boots as well at work. -Continue wearing orthotics at all times.  No follow-ups on file.

## 2020-07-21 ENCOUNTER — Encounter: Payer: 59 | Admitting: Obstetrics and Gynecology

## 2020-07-29 ENCOUNTER — Ambulatory Visit: Payer: 59 | Admitting: Podiatry

## 2020-12-30 ENCOUNTER — Ambulatory Visit (INDEPENDENT_AMBULATORY_CARE_PROVIDER_SITE_OTHER): Payer: 59 | Admitting: Nurse Practitioner

## 2020-12-30 ENCOUNTER — Encounter: Payer: Self-pay | Admitting: Nurse Practitioner

## 2020-12-30 ENCOUNTER — Other Ambulatory Visit: Payer: Self-pay

## 2020-12-30 VITALS — BP 158/94 | Ht 67.0 in | Wt 284.0 lb

## 2020-12-30 DIAGNOSIS — N951 Menopausal and female climacteric states: Secondary | ICD-10-CM

## 2020-12-30 DIAGNOSIS — Z90710 Acquired absence of both cervix and uterus: Secondary | ICD-10-CM | POA: Diagnosis not present

## 2020-12-30 DIAGNOSIS — Z01419 Encounter for gynecological examination (general) (routine) without abnormal findings: Secondary | ICD-10-CM

## 2020-12-30 NOTE — Patient Instructions (Signed)
Devra Dopp, Black Cohosh, OR Ginseng

## 2020-12-30 NOTE — Progress Notes (Signed)
   Kristen Shepherd December 01, 1975 [id number]   History:  45 y.o. G0 presents for annual exam. She complains of hot flashes, night sweats, and mood changes that started a few months ago. 04/2020 hysterectomy with bilateral salpingectomies for AUB. Normal pap and mammogram history.   Gynecologic History Patient's last menstrual period was 02/03/2019.   Contraception/Family planning: status post hysterectomy Sexually active: Yes  Health Maintenance Last Pap: 07/18/2018. Results were: Normal Last mammogram: 02/19/2020. Results were: Normal Last colonoscopy: Never Last Dexa: Not indicated  Past medical history, past surgical history, family history and social history were all reviewed and documented in the EPIC chart. Married. 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 Stepdaughter is senior in [employer], plays basketball.   ROS:  A ROS was performed and pertinent positives and negatives are included.  Exam:  Vitals:   12/30/20 1451  BP: (!) 158/94  Weight: 284 lb (128.8 kg)  Height: '5\' 7"'$  (1.702 m)   Body mass index is 44.48 kg/m.  General appearance:  Normal Thyroid:  Symmetrical, normal in size, without palpable masses or nodularity. Respiratory  Auscultation:  Clear without wheezing or rhonchi Cardiovascular  Auscultation:  Regular rate, without rubs, murmurs or gallops  Edema/varicosities:  Not grossly evident Abdominal  Soft,nontender, without masses, guarding or rebound.  Liver/spleen:  No organomegaly noted  Hernia:  None appreciated  Skin  Inspection:  Grossly normal Breasts: Examined lying and sitting.   Right: Without masses, retractions, nipple discharge or axillary adenopathy.   Left: Without masses, retractions, nipple discharge or axillary adenopathy. Genitourinary   Inguinal/mons:  Normal without inguinal adenopathy  External genitalia:  Normal appearing vulva with no masses, tenderness, or lesions  BUS/Urethra/Skene's glands:  Normal  Vagina:  Normal appearing with normal color and  discharge, no lesions  Cervix:  Absent  Uterus:  Absent  Adnexa/parametria:     Rt: Normal in size, without masses or tenderness.   Lt: Normal in size, without masses or tenderness.  Anus and perineum: Normal  Digital rectal exam: Normal sphincter tone without palpated masses or tenderness  Patient informed chaperone available to be present for breast and pelvic exam. Patient has requested no chaperone to be present. Patient has been advised what will be completed during breast and pelvic exam.   Assessment/Plan:  45 y.o. G0 for annual exam.   Well female exam with routine gynecological exam - Education provided on SBEs, importance of preventative screenings, current guidelines, high calcium diet, regular exercise, and multivitamin daily.  History of robot-assisted laparoscopic hysterectomy - 04/2020 for DUB  Vasomotor symptoms due to menopause - Plan: Follicle stimulating hormone. She complains of hot flashes, night sweats, and mood changes that started a few months ago. We discussed management options to include lifestyle modifications, OTC supplements, SSRI, and HRT. She has hypertension, so it is best to avoid estrogen-containing products. BP today 158/94. She would like to try OTC supplements and was provided with examples. She is willing to try SSRI if this is not effective.   Screening for cervical cancer - Normal Pap history. No longer screening per guidelines.   Screening for breast cancer - Normal mammogram history.  Continue annual screenings.  Normal breast exam today.  Screening for colon cancer - will plan to start screenings at age 51.   Return in 1 year for annual.   Tamela Gammon DNP, 3:22 PM 12/30/2020

## 2020-12-31 LAB — FOLLICLE STIMULATING HORMONE: FSH: 7.9 m[IU]/mL

## 2021-01-03 ENCOUNTER — Other Ambulatory Visit: Payer: Self-pay | Admitting: Nurse Practitioner

## 2021-01-03 ENCOUNTER — Telehealth: Payer: Self-pay | Admitting: *Deleted

## 2021-01-03 ENCOUNTER — Encounter: Payer: Self-pay | Admitting: Nurse Practitioner

## 2021-01-03 DIAGNOSIS — N951 Menopausal and female climacteric states: Secondary | ICD-10-CM

## 2021-01-03 NOTE — Telephone Encounter (Signed)
I had provided them on her AVS education. Some OTC options include Amberen, [place], Black Cohosh, OR Ginseng. Make sure she only chooses one.

## 2021-01-03 NOTE — Telephone Encounter (Signed)
My chart message from pt asking what is the recommended OTC supplements she can take to help her with menopause. I will route this to Marny Lowenstein NP.  Last visit 12/30/2020 it was discussed see below:  Vasomotor symptoms due to menopause - Plan: Follicle stimulating hormone. She complains of hot flashes, night sweats, and mood changes that started a few months ago. We discussed management options to include lifestyle modifications, OTC supplements, SSRI, and HRT. She has hypertension, so it is best to avoid estrogen-containing products. BP today 158/94. She would like to try OTC supplements and was provided with examples. She is willing to try SSRI if this is not effective.

## 2021-01-03 NOTE — Telephone Encounter (Signed)
Pt aware of OTC medications and will choose one that will best fit her

## 2021-02-23 ENCOUNTER — Other Ambulatory Visit: Payer: Self-pay | Admitting: Family Medicine

## 2021-02-23 DIAGNOSIS — Z1231 Encounter for screening mammogram for malignant neoplasm of breast: Secondary | ICD-10-CM

## 2021-03-10 ENCOUNTER — Other Ambulatory Visit: Payer: Self-pay

## 2021-03-10 ENCOUNTER — Ambulatory Visit
Admission: RE | Admit: 2021-03-10 | Discharge: 2021-03-10 | Disposition: A | Discharge status: Home / Self Care | Payer: 59 | Source: Ambulatory Visit | Attending: Family Medicine | Admitting: Family Medicine

## 2021-03-10 DIAGNOSIS — Z1231 Encounter for screening mammogram for malignant neoplasm of breast: Secondary | ICD-10-CM | POA: Diagnosis present

## 2021-05-24 ENCOUNTER — Ambulatory Visit (INDEPENDENT_AMBULATORY_CARE_PROVIDER_SITE_OTHER): Payer: 59

## 2021-05-24 ENCOUNTER — Other Ambulatory Visit: Payer: Self-pay

## 2021-05-24 ENCOUNTER — Ambulatory Visit: Payer: 59 | Admitting: Podiatry

## 2021-05-24 ENCOUNTER — Encounter: Payer: Self-pay | Admitting: Podiatry

## 2021-05-24 DIAGNOSIS — Q666 Other congenital valgus deformities of feet: Secondary | ICD-10-CM | POA: Diagnosis not present

## 2021-05-24 DIAGNOSIS — M76821 Posterior tibial tendinitis, right leg: Secondary | ICD-10-CM | POA: Diagnosis not present

## 2021-05-25 ENCOUNTER — Encounter: Payer: Self-pay | Admitting: Podiatry

## 2021-05-25 NOTE — Progress Notes (Signed)
Subjective:  Patient ID: Kristen Shepherd, female    DOB: [birth year],  MRN: [id number]  Chief Complaint  Patient presents with   Foot Pain    RIGHT foot pain is starting to come back possible ingrown nail on right hallux     46 y.o. female presents with the above complaint.  Patient presents with continuous pain to the right medial foot secondary to posterior tibial tendon secondary to pes planovalgus.  She states that she says the swelling the pain is started to come back.  She has some relief since last year.  However Hurts she cannot wear the brace with the shoes.  The orthotics are causing a lot of discomfort.  She wanted to get the orthotics evaluated.  She denies any other acute complaints she would like to discuss treatments for this as well as another complaint of interdigital maceration between the left first and second.  She has not been doing anything for the maceration.  She denies any other acute issues.   Review of Systems: Negative except as noted in the HPI. Denies N/V/F/Ch.  Past Medical History:  Diagnosis Date   Arthritis    knees, elbows, shoulders, ankles, wrists   DUB (dysfunctional uterine bleeding) 6.22.12   Endometrial polyp 6.22.12   Headache    Hypertension    IUD 11/24/10   MIRENA INSERTED 11/24/10   Vitamin D deficiency     Current Outpatient Medications:    amLODipine (NORVASC) 10 MG tablet, Take 10 mg by mouth daily., Disp: , Rfl:    B Complex-C (SUPER B COMPLEX PO), Take 1 tablet by mouth daily., Disp: , Rfl:    cholecalciferol (VITAMIN D3) 25 MCG (1000 UNIT) tablet, Take 1,000 Units by mouth daily., Disp: , Rfl:    docusate sodium (COLACE) 100 MG capsule, Take 1 capsule (100 mg total) by mouth 2 (two) times daily as needed (constipation)., Disp: , Rfl:    Multiple Vitamin (MULTIVITAMIN WITH MINERALS) TABS tablet, Take 1 tablet by mouth daily. One-A-Day for Women, Disp: , Rfl:   Social History   Tobacco Use  Smoking Status Never  Smokeless  Tobacco Never    Allergies  Allergen Reactions   Meloxicam Other (See Comments)    Felt like having a heart attack    Objective:  There were no vitals filed for this visit. There is no height or weight on file to calculate BMI. Constitutional Well developed. Well nourished.  Vascular Dorsalis pedis pulses palpable bilaterally. Posterior tibial pulses palpable bilaterally. Capillary refill normal to all digits.  No cyanosis or clubbing noted. Pedal hair growth normal.  Neurologic Normal speech. Oriented to person, place, and time. Epicritic sensation to light touch grossly present bilaterally.  Dermatologic Nails well groomed and normal in appearance. No open wounds. No skin lesions.  Orthopedic:  Pain on palpation along the course of the posterior tibial tendon including the insertion.  Pain with the resisted dorsiflexion inversion of the foot.  No pain with dorsiflexion eversion of the foot active and passive.  No pain at the Achilles tendon at the peroneal tendon at the ATFL ligament.  Interdigital maceration noted between the left first and second interspace.  No open wounds or lesions noted.  No malodor present.  Gait examination shows severe pes planovalgus deformity that is rigid in nature.  Calcaneovalgus noted with too many toe signs unable to recruit the arch with dorsiflexion of the hallux.  Patient is unable to do single and double heel raises.   Radiographs:  3 views of skeletally mature adult right foot: 3 views of skeletally mature adult right foot:Bunion deformity noted moderate.  Severe pes planovalgus noted with calcaneovalgus to many toe signs there is decrease in inclination angle of the calcaneus.  There is increasing talar declination angle.  Mild elevatus present at the first MPJ.  Exostosis noted at the navicular.  No coalition present.  IMPRESSION: Mild posterior tibialis tenosynovitis   Within the area of the tarsal tunnel at the level of the  hindfoot possible small varicosities present. No soft tissue mass is seen.   Edema seen within the sinus tarsi. Assessment:   1. Posterior tibial tendinitis of right leg   2. Pes planovalgus    Plan:  Patient was evaluated and treated and all questions answered.  Right posterior tibial tendinitis insertional pain with underlying navicular exostosis with underlying pes planovalgus -Clinically her pain has reoccurred and is started hard to get in the posterior tibial tendon insertion.  The primary driving force for this is pes planovalgus at this time she would benefit from surgical intervention however I would like to obtain a new MRI as the previous MRI is greater than-year-old.  She would also benefit from a steroid injection to help decrease acute inflammatory component associate with pain.  Patient agrees with plan like to proceed with steroid injection I discussed with her that there is a risk of rupture associated given that this is near the tendon.  She states understand like to proceed despite the risks -New MRI was ordered -We will discuss surgical options during next clinical visit based on MRI findings   Pes planovalgus rigid -I explained patient the etiology of pes planovalgus and various treatment options were discussed especially in the setting of posterior tibial tendinitis.  I believe patient will benefit from custom-made orthotics to help support the minimal arch that she has and control the hindfoot motion therefore take the stress off of the posterior tibial tendon.  Patient agrees with the plan would like to proceed with obtaining orthotics as she is tends to wear elastic toe boots as well at work. -I have resent the orthotics to be done/redone.  No follow-ups on file.

## 2021-06-10 ENCOUNTER — Ambulatory Visit
Admission: RE | Admit: 2021-06-10 | Discharge: 2021-06-10 | Disposition: A | Discharge status: Home / Self Care | Payer: 59 | Source: Ambulatory Visit | Attending: Podiatry | Admitting: Podiatry

## 2021-06-10 DIAGNOSIS — M76821 Posterior tibial tendinitis, right leg: Secondary | ICD-10-CM

## 2021-06-21 ENCOUNTER — Ambulatory Visit: Payer: 59 | Admitting: Podiatry

## 2021-06-21 ENCOUNTER — Other Ambulatory Visit: Payer: Self-pay

## 2021-06-21 DIAGNOSIS — Q666 Other congenital valgus deformities of feet: Secondary | ICD-10-CM

## 2021-06-21 DIAGNOSIS — M76821 Posterior tibial tendinitis, right leg: Secondary | ICD-10-CM

## 2021-06-22 NOTE — Progress Notes (Signed)
Subjective:  Patient ID: Kristen Shepherd, female    DOB: 29-Oct-1975,  MRN: [id number]  Chief Complaint  Patient presents with   Foot Pain    46 y.o. female presents with the above complaint.  Patient presents follow-up for right posterior tibial tendinitis/medial foot pain.  Patient is here to go over the results of an MRI.  She states her pain is on and off.  She is also here to pick up her new orthotics.  She denies any other acute issues.   Review of Systems: Negative except as noted in the HPI. Denies N/V/F/Ch.  Past Medical History:  Diagnosis Date   Arthritis    knees, elbows, shoulders, ankles, wrists   DUB (dysfunctional uterine bleeding) 6.22.12   Endometrial polyp 6.22.12   Headache    Hypertension    IUD 11/24/10   MIRENA INSERTED 11/24/10   Vitamin D deficiency     Current Outpatient Medications:    amLODipine (NORVASC) 10 MG tablet, Take 10 mg by mouth daily., Disp: , Rfl:    B Complex-C (SUPER B COMPLEX PO), Take 1 tablet by mouth daily., Disp: , Rfl:    cholecalciferol (VITAMIN D3) 25 MCG (1000 UNIT) tablet, Take 1,000 Units by mouth daily., Disp: , Rfl:    docusate sodium (COLACE) 100 MG capsule, Take 1 capsule (100 mg total) by mouth 2 (two) times daily as needed (constipation)., Disp: , Rfl:    Multiple Vitamin (MULTIVITAMIN WITH MINERALS) TABS tablet, Take 1 tablet by mouth daily. One-A-Day for Women, Disp: , Rfl:   Social History   Tobacco Use  Smoking Status Never  Smokeless Tobacco Never    Allergies  Allergen Reactions   Meloxicam Other (See Comments)    Felt like having a heart attack    Objective:  There were no vitals filed for this visit. There is no height or weight on file to calculate BMI. Constitutional Well developed. Well nourished.  Vascular Dorsalis pedis pulses palpable bilaterally. Posterior tibial pulses palpable bilaterally. Capillary refill normal to all digits.  No cyanosis or clubbing noted. Pedal hair growth normal.   Neurologic Normal speech. Oriented to person, place, and time. Epicritic sensation to light touch grossly present bilaterally.  Dermatologic Nails well groomed and normal in appearance. No open wounds. No skin lesions.  Orthopedic:  Pain on palpation along the course of the posterior tibial tendon including the insertion.  Pain with the resisted dorsiflexion inversion of the foot.  No pain with dorsiflexion eversion of the foot active and passive.  No pain at the Achilles tendon at the peroneal tendon at the ATFL ligament.  Interdigital maceration noted between the left first and second interspace.  No open wounds or lesions noted.  No malodor present.  Gait examination shows severe pes planovalgus deformity that is rigid in nature.  Calcaneovalgus noted with too many toe signs unable to recruit the arch with dorsiflexion of the hallux.  Patient is unable to do single and double heel raises.   Radiographs: 3 views of skeletally mature adult right foot: 3 views of skeletally mature adult right foot:Bunion deformity noted moderate.  Severe pes planovalgus noted with calcaneovalgus to many toe signs there is decrease in inclination angle of the calcaneus.  There is increasing talar declination angle.  Mild elevatus present at the first MPJ.  Exostosis noted at the navicular.  No coalition present.  IMPRESSION: 1. Mild tendinosis of the posterior tibial tendon with mild tenosynovitis. 2. Mild tendinosis of the peroneus brevis.  Edema seen within the sinus tarsi. Assessment:   No diagnosis found.  Plan:  Patient was evaluated and treated and all questions answered.  Right posterior tibial tendinitis insertional pain with underlying navicular exostosis with underlying pes planovalgus -Clinically her pain has reoccurred and is started hard to get in the posterior tibial tendon insertion.  The primary driving force for this is pes planovalgus at this time she would benefit from surgical  intervention however I would like to obtain a new MRI as the previous MRI is greater than-year-old.  She would also benefit from a steroid injection to help decrease acute inflammatory component associate with pain.  Patient agrees with plan like to proceed with steroid injection I discussed with her that there is a risk of rupture associated given that this is near the tendon.  She states understand like to proceed despite the risks -MRI was reviewed which shows more tendinosis of the posterior tibial tendon.  However pain is clinically getting a little bit better.  At this time we will continue clinical monitoring if there is any worsening we discussed surgical options at that time.  Patient states understanding   Pes planovalgus rigid -I explained patient the etiology of pes planovalgus and various treatment options were discussed especially in the setting of posterior tibial tendinitis.  I believe patient will benefit from custom-made orthotics to help support the minimal arch that she has and control the hindfoot motion therefore take the stress off of the posterior tibial tendon.  Patient agrees with the plan would like to proceed with obtaining orthotics as she is tends to wear elastic toe boots as well at work. -New orthotics were dispensed seem to be functioning well.  No follow-ups on file.

## 2021-09-20 ENCOUNTER — Ambulatory Visit: Payer: 59 | Admitting: Podiatry

## 2021-09-27 ENCOUNTER — Ambulatory Visit: Payer: 59 | Admitting: Podiatry

## 2021-09-27 ENCOUNTER — Encounter: Payer: Self-pay | Admitting: Podiatry

## 2021-09-27 DIAGNOSIS — M76821 Posterior tibial tendinitis, right leg: Secondary | ICD-10-CM | POA: Diagnosis not present

## 2021-09-27 NOTE — Progress Notes (Signed)
?Subjective:  ?Patient ID: Kristen Shepherd, female    DOB: 1975/11/07,  MRN: [id number] ? ?Chief Complaint  ?Patient presents with  ? Foot Pain  ? ? ?46 y.o. female presents with the above complaint.  Patient presents follow-up for right posterior tibial tendinitis/medial foot pain.  She states the pain is still about the same has not gotten any better.  Has not gotten any worse either.  She has been wearing orthotics.  She denies any other acute complaints. ? ? ?Review of Systems: Negative except as noted in the HPI. Denies N/V/F/Ch. ? ?Past Medical History:  ?Diagnosis Date  ? Arthritis   ? knees, elbows, shoulders, ankles, wrists  ? DUB (dysfunctional uterine bleeding) 6.22.12  ? Endometrial polyp 6.22.12  ? Headache   ? Hypertension   ? IUD 11/24/10  ? MIRENA INSERTED 11/24/10  ? Vitamin D deficiency   ? ? ?Current Outpatient Medications:  ?  amLODipine (NORVASC) 10 MG tablet, Take 10 mg by mouth daily., Disp: , Rfl:  ?  B Complex-C (SUPER B COMPLEX PO), Take 1 tablet by mouth daily., Disp: , Rfl:  ?  cholecalciferol (VITAMIN D3) 25 MCG (1000 UNIT) tablet, Take 1,000 Units by mouth daily., Disp: , Rfl:  ?  docusate sodium (COLACE) 100 MG capsule, Take 1 capsule (100 mg total) by mouth 2 (two) times daily as needed (constipation)., Disp: , Rfl:  ?  Multiple Vitamin (MULTIVITAMIN WITH MINERALS) TABS tablet, Take 1 tablet by mouth daily. One-A-Day for Women, Disp: , Rfl:  ? ?Social History  ? ?Tobacco Use  ?Smoking Status Never  ?Smokeless Tobacco Never  ? ? ?Allergies  ?Allergen Reactions  ? Meloxicam Other (See Comments)  ?  Felt like having a heart attack ?  ? ?Objective:  ?There were no vitals filed for this visit. ?There is no height or weight on file to calculate BMI. ?Constitutional Well developed. ?Well nourished.  ?Vascular Dorsalis pedis pulses palpable bilaterally. ?Posterior tibial pulses palpable bilaterally. ?Capillary refill normal to all digits.  ?No cyanosis or clubbing noted. ?Pedal hair growth  normal.  ?Neurologic Normal speech. ?Oriented to person, place, and time. ?Epicritic sensation to light touch grossly present bilaterally.  ?Dermatologic Nails well groomed and normal in appearance. ?No open wounds. ?No skin lesions.  ?Orthopedic:  Pain on palpation along the course of the posterior tibial tendon including the insertion.  Pain with the resisted dorsiflexion inversion of the foot.  No pain with dorsiflexion eversion of the foot active and passive.  No pain at the Achilles tendon at the peroneal tendon at the ATFL ligament. ? ?Interdigital maceration noted between the left first and second interspace.  No open wounds or lesions noted.  No malodor present. ? ?Gait examination shows severe pes planovalgus deformity that is rigid in nature.  Calcaneovalgus noted with too many toe signs unable to recruit the arch with dorsiflexion of the hallux.  Patient is unable to do single and double heel raises.  ? ?Radiographs: 3 views of skeletally mature adult right foot: 3 views of skeletally mature adult right foot:Bunion deformity noted moderate.  Severe pes planovalgus noted with calcaneovalgus to many toe signs there is decrease in inclination angle of the calcaneus.  There is increasing talar declination angle.  Mild elevatus present at the first MPJ.  Exostosis noted at the navicular.  No coalition present. ? ?IMPRESSION: ?1. Mild tendinosis of the posterior tibial tendon with mild ?tenosynovitis. ?2. Mild tendinosis of the peroneus brevis. ?  ?Edema seen within  the sinus tarsi. ?Assessment:  ? ?1. Posterior tibial tendinitis of right leg   ? ? ?Plan:  ?Patient was evaluated and treated and all questions answered. ? ?Right posterior tibial tendinitis insertional pain with underlying navicular exostosis with underlying pes planovalgus ?-Clinically her pain is about the same.  The injection Rozetta Nunnery none of which has helped.  MRI does show that there is tendinitis going on as well. ?-I discussed PRP  injection with her and she will be scheduled to undergo PRP at this time.  We will discuss surgical options if there is no improvement with PRP ?-MRI was reviewed which shows more tendinosis of the posterior tibial tendon.  However pain is clinically getting a little bit better.  At this time we will continue clinical monitoring if there is any worsening we discussed surgical options at that time.  Patient states understanding ? ? ?Pes planovalgus rigid ?-I explained patient the etiology of pes planovalgus and various treatment options were discussed especially in the setting of posterior tibial tendinitis.  I believe patient will benefit from custom-made orthotics to help support the minimal arch that she has and control the hindfoot motion therefore take the stress off of the posterior tibial tendon.  Patient agrees with the plan would like to proceed with obtaining orthotics as she is tends to wear elastic toe boots as well at work. ?-New orthotics were dispensed seem to be functioning well. ? ?No follow-ups on file.  ?

## 2021-10-07 ENCOUNTER — Encounter: Admission: RE | Payer: Self-pay | Source: Home / Self Care

## 2021-10-07 ENCOUNTER — Ambulatory Visit: Admission: RE | Admit: 2021-10-07 | Payer: 59 | Source: Home / Self Care | Admitting: Gastroenterology

## 2021-10-07 SURGERY — COLONOSCOPY WITH PROPOFOL
Anesthesia: General

## 2021-11-16 ENCOUNTER — Other Ambulatory Visit: Payer: 59

## 2022-01-02 ENCOUNTER — Encounter: Payer: Self-pay | Admitting: Nurse Practitioner

## 2022-01-02 ENCOUNTER — Ambulatory Visit (INDEPENDENT_AMBULATORY_CARE_PROVIDER_SITE_OTHER): Payer: 59 | Admitting: Nurse Practitioner

## 2022-01-02 VITALS — BP 132/78 | HR 89 | Ht 66.25 in | Wt 296.0 lb

## 2022-01-02 DIAGNOSIS — N951 Menopausal and female climacteric states: Secondary | ICD-10-CM

## 2022-01-02 DIAGNOSIS — Z01419 Encounter for gynecological examination (general) (routine) without abnormal findings: Secondary | ICD-10-CM

## 2022-01-02 MED ORDER — VEOZAH 45 MG PO TABS: 1.0000 | ORAL_TABLET | Freq: Every day | ORAL | 1 refills | Status: AC

## 2022-01-02 NOTE — Progress Notes (Signed)
   Kristen Shepherd 1976/01/16 [id number]   History:  46 y.o. G0 presents for annual exam. Complains of hot flashes and night sweats. Discussed OTC supplements and SSRI last year. Estrogen not recommended due to HTN. S/P 04/2020 hysterectomy with bilateral salpingectomies for AUB. Normal pap and mammogram history.   Gynecologic History Patient's last menstrual period was 02/03/2019.   Contraception/Family planning: status post hysterectomy Sexually active: Yes  Health Maintenance Last Pap: 07/18/2018. Results were: Normal Last mammogram: 03/10/2021. Results were: Normal Last colonoscopy: Never. Scheduled in November Last Dexa: Not indicated  Past medical history, past surgical history, family history and social history were all reviewed and documented in the EPIC chart. Married. 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 Stepdaughter is going to [employer], thinking bio med Engineer, production.   ROS:  A ROS was performed and pertinent positives and negatives are included.  Exam:  Vitals:   01/02/22 1530  BP: 132/78  Pulse: 89  SpO2: 98%  Weight: 296 lb (134.3 kg)  Height: 5' 6.25" (1.683 m)    Body mass index is 47.42 kg/m.  General appearance:  Normal Thyroid:  Symmetrical, normal in size, without palpable masses or nodularity. Respiratory  Auscultation:  Clear without wheezing or rhonchi Cardiovascular  Auscultation:  Regular rate, without rubs, murmurs or gallops  Edema/varicosities:  Not grossly evident Abdominal  Soft,nontender, without masses, guarding or rebound.  Liver/spleen:  No organomegaly noted  Hernia:  None appreciated  Skin  Inspection:  Grossly normal Breasts: Examined lying and sitting.   Right: Without masses, retractions, nipple discharge or axillary adenopathy.   Left: Without masses, retractions, nipple discharge or axillary adenopathy. Genitourinary   Inguinal/mons:  Normal without inguinal adenopathy  External genitalia:  Normal appearing vulva with no  masses, tenderness, or lesions  BUS/Urethra/Skene's glands:  Normal  Vagina:  Normal appearing with normal color and discharge, no lesions  Cervix:  Absent  Uterus:  Absent  Adnexa/parametria:     Rt: Normal in size, without masses or tenderness.   Lt: Normal in size, without masses or tenderness.  Anus and perineum: Normal  Digital rectal exam: Deferred  Patient informed chaperone available to be present for breast and pelvic exam. Patient has requested no chaperone to be present. Patient has been advised what will be completed during breast and pelvic exam.   Assessment/Plan:  46 y.o. G0 for annual exam.   Well female exam with routine gynecological exam - Education provided on SBEs, importance of preventative screenings, current guidelines, high calcium diet, regular exercise, and multivitamin daily. Labs with PCP.  Vasomotor symptoms due to menopause - Plan: Fezolinetant (VEOZAH) 45 MG TABS daily for management of hot flashes and night sweats. Discussed new FDA approved medication for postmenopausal hot flashes. ERT not recommended due to HTN.   Screening for cervical cancer - Normal Pap history. No longer screening per guidelines.   Screening for breast cancer - Normal mammogram history.  Continue annual screenings.  Normal breast exam today.  Screening for colon cancer - Scheduled in November.   Return in 1 year for annual.   Tamela Gammon DNP, 3:52 PM 01/02/2022

## 2022-01-30 ENCOUNTER — Other Ambulatory Visit: Payer: Self-pay | Admitting: Family Medicine

## 2022-01-30 DIAGNOSIS — Z1231 Encounter for screening mammogram for malignant neoplasm of breast: Secondary | ICD-10-CM

## 2022-03-13 ENCOUNTER — Ambulatory Visit
Admission: RE | Admit: 2022-03-13 | Discharge: 2022-03-13 | Disposition: A | Discharge status: Home / Self Care | Payer: 59 | Source: Ambulatory Visit | Attending: Family Medicine | Admitting: Family Medicine

## 2022-03-13 DIAGNOSIS — Z1231 Encounter for screening mammogram for malignant neoplasm of breast: Secondary | ICD-10-CM | POA: Diagnosis present

## 2022-03-27 ENCOUNTER — Ambulatory Visit
Admission: RE | Admit: 2022-03-27 | Discharge: 2022-03-27 | Disposition: A | Discharge status: Home / Self Care | Payer: 59 | Attending: Gastroenterology | Admitting: Gastroenterology

## 2022-03-27 ENCOUNTER — Encounter
Admission: RE | Disposition: A | Discharge status: Home / Self Care | Payer: Self-pay | Source: Home / Self Care | Attending: Gastroenterology

## 2022-03-27 ENCOUNTER — Encounter: Payer: Self-pay | Admitting: *Deleted

## 2022-03-27 ENCOUNTER — Ambulatory Visit: Payer: 59 | Admitting: Anesthesiology

## 2022-03-27 DIAGNOSIS — Z6841 Body Mass Index (BMI) 40.0 and over, adult: Secondary | ICD-10-CM | POA: Insufficient documentation

## 2022-03-27 DIAGNOSIS — R519 Headache, unspecified: Secondary | ICD-10-CM | POA: Insufficient documentation

## 2022-03-27 DIAGNOSIS — Z1211 Encounter for screening for malignant neoplasm of colon: Secondary | ICD-10-CM | POA: Diagnosis present

## 2022-03-27 DIAGNOSIS — I10 Essential (primary) hypertension: Secondary | ICD-10-CM | POA: Diagnosis not present

## 2022-03-27 DIAGNOSIS — K640 First degree hemorrhoids: Secondary | ICD-10-CM | POA: Insufficient documentation

## 2022-03-27 DIAGNOSIS — M1990 Unspecified osteoarthritis, unspecified site: Secondary | ICD-10-CM | POA: Insufficient documentation

## 2022-03-27 HISTORY — PX: COLONOSCOPY WITH PROPOFOL: SHX5780

## 2022-03-27 SURGERY — COLONOSCOPY WITH PROPOFOL
Anesthesia: General

## 2022-03-27 MED ORDER — SODIUM CHLORIDE 0.9 % IV SOLN: INTRAVENOUS | Status: DC

## 2022-03-27 MED ORDER — PROPOFOL 10 MG/ML IV BOLUS
INTRAVENOUS | Status: DC | PRN
  Administered 2022-03-27: 50 mg via INTRAVENOUS

## 2022-03-27 MED ORDER — KETAMINE HCL 10 MG/ML IJ SOLN
INTRAMUSCULAR | Status: DC | PRN
  Administered 2022-03-27: 20 mg via INTRAVENOUS

## 2022-03-27 MED ORDER — LIDOCAINE HCL (CARDIAC) PF 100 MG/5ML IV SOSY
PREFILLED_SYRINGE | INTRAVENOUS | Status: DC | PRN
  Administered 2022-03-27: 50 mg via INTRAVENOUS

## 2022-03-27 MED ORDER — DEXMEDETOMIDINE HCL IN NACL 80 MCG/20ML IV SOLN
INTRAVENOUS | Status: DC | PRN
  Administered 2022-03-27: 12 ug via BUCCAL

## 2022-03-27 MED ORDER — KETAMINE HCL 50 MG/5ML IJ SOSY
PREFILLED_SYRINGE | INTRAMUSCULAR | Status: AC
  Filled 2022-03-27: qty 5

## 2022-03-27 MED ORDER — PHENYLEPHRINE HCL (PRESSORS) 10 MG/ML IV SOLN
INTRAVENOUS | Status: DC | PRN
  Administered 2022-03-27 (×2): 160 ug via INTRAVENOUS

## 2022-03-27 MED ORDER — PROPOFOL 500 MG/50ML IV EMUL
INTRAVENOUS | Status: DC | PRN
  Administered 2022-03-27: 140 ug/kg/min via INTRAVENOUS

## 2022-03-27 MED ORDER — PROPOFOL 1000 MG/100ML IV EMUL
INTRAVENOUS | Status: AC
  Filled 2022-03-27: qty 100

## 2022-03-27 NOTE — Op Note (Signed)
[facility] Patient Name: Kristen Shepherd Procedure Date: 03/27/2022 1:51 PM MRN: [id number] Account #: [id number] Date of Birth: 01-28-1976 Admit Type: Outpatient Age: 46 Room: [facility] ENDO ROOM 3 Gender: Female Note Status: Finalized Instrument Name: Jasper Riling [id number] Procedure:             Colonoscopy Indications:           Screening for colorectal malignant neoplasm Providers:             Andrey Farmer MD, MD Medicines:             Monitored Anesthesia Care Complications:         No immediate complications. Procedure:             Pre-Anesthesia Assessment:                        - Prior to the procedure, a History and Physical was                         performed, and patient medications and allergies were                         reviewed. The patient is competent. The risks and                         benefits of the procedure and the sedation options and                         risks were discussed with the patient. All questions                         were answered and informed consent was obtained.                         Patient identification and proposed procedure were                         verified by the physician, the nurse, the                         anesthesiologist, the anesthetist and the technician                         in the endoscopy suite. Mental Status Examination:                         alert and oriented. Airway Examination: normal                         oropharyngeal airway and neck mobility. Respiratory                         Examination: clear to auscultation. CV Examination:                         normal. Prophylactic Antibiotics: The patient does not                         require prophylactic antibiotics. Prior  Anticoagulants: The patient has taken no anticoagulant                         or antiplatelet agents. ASA Grade Assessment: III - A                         patient with  severe systemic disease. After reviewing                         the risks and benefits, the patient was deemed in                         satisfactory condition to undergo the procedure. The                         anesthesia plan was to use monitored anesthesia care                         (MAC). Immediately prior to administration of                         medications, the patient was re-assessed for adequacy                         to receive sedatives. The heart rate, respiratory                         rate, oxygen saturations, blood pressure, adequacy of                         pulmonary ventilation, and response to care were                         monitored throughout the procedure. The physical                         status of the patient was re-assessed after the                         procedure.                        After obtaining informed consent, the colonoscope was                         passed under direct vision. Throughout the procedure,                         the patient's blood pressure, pulse, and oxygen                         saturations were monitored continuously. The                         Colonoscope was introduced through the anus and                         advanced to the the cecum, identified by appendiceal  orifice and ileocecal valve. The colonoscopy was                         performed without difficulty. The patient tolerated                         the procedure well. The quality of the bowel                         preparation was good. The ileocecal valve, appendiceal                         orifice, and rectum were photographed. Findings:      The perianal and digital rectal examinations were normal.      Internal hemorrhoids were found during retroflexion. The hemorrhoids       were Grade I (internal hemorrhoids that do not prolapse).      The exam was otherwise without abnormality on direct and retroflexion        views. Impression:            - Internal hemorrhoids.                        - The examination was otherwise normal on direct and                         retroflexion views.                        - No specimens collected. Recommendation:        - Discharge patient to home.                        - Resume previous diet.                        - Continue present medications.                        - Repeat colonoscopy in 10 years for screening                         purposes.                        - Return to referring physician as previously                         scheduled. Procedure Code(s):     --- Professional ---                        C5885, Colorectal cancer screening; colonoscopy on                         individual not meeting criteria for high risk Diagnosis Code(s):     --- Professional ---                        Z12.11, Encounter for screening for malignant neoplasm  of colon                        K64.0, First degree hemorrhoids CPT copyright 2022 American Medical Association. All rights reserved. The codes documented in this report are preliminary and upon coder review may  be revised to meet current compliance requirements. Andrey Farmer MD, MD 03/27/2022 2:17:12 PM Number of Addenda: 0 Note Initiated On: 03/27/2022 1:51 PM Scope Withdrawal Time: 0 hours 7 minutes 58 seconds  Total Procedure Duration: 0 hours 13 minutes 12 seconds  Estimated Blood Loss:  Estimated blood loss: none.      [facility]

## 2022-03-27 NOTE — Anesthesia Preprocedure Evaluation (Signed)
Anesthesia Evaluation  Patient identified by MRN, date of birth, ID band Patient awake    Reviewed: Allergy & Precautions, NPO status , Patient's Chart, lab work & pertinent test results  History of Anesthesia Complications Negative for: history of anesthetic complications  Airway Mallampati: II  TM Distance: >3 FB Neck ROM: Full    Dental no notable dental hx. (+) Teeth Intact   Pulmonary neg pulmonary ROS, neg sleep apnea, neg COPD, Patient abstained from smoking.Not current smoker   Pulmonary exam normal breath sounds clear to auscultation       Cardiovascular Exercise Tolerance: Good METShypertension, Pt. on medications (-) CAD and (-) Past MI (-) dysrhythmias  Rhythm:Regular Rate:Normal - Systolic murmurs    Neuro/Psych  Headaches  negative psych ROS   GI/Hepatic ,neg GERD  ,,(+)     (-) substance abuse    Endo/Other  neg diabetes  Morbid obesity  Renal/GU negative Renal ROS     Musculoskeletal   Abdominal  (+) + obese  Peds  Hematology   Anesthesia Other Findings Past Medical History: No date: Arthritis     Comment:  knees, elbows, shoulders, ankles, wrists 6.22.12: DUB (dysfunctional uterine bleeding) 6.22.12: Endometrial polyp No date: Headache No date: Hypertension 11/24/10: IUD     Comment:  MIRENA INSERTED 11/24/10 No date: Vitamin D deficiency  Reproductive/Obstetrics                              Anesthesia Physical Anesthesia Plan  ASA: 3  Anesthesia Plan: General   Post-op Pain Management: Minimal or no pain anticipated   Induction: Intravenous  PONV Risk Score and Plan: 3 and Propofol infusion, TIVA and Ondansetron  Airway Management Planned: Nasal Cannula  Additional Equipment: None  Intra-op Plan:   Post-operative Plan:   Informed Consent: I have reviewed the patients History and Physical, chart, labs and discussed the procedure including the risks,  benefits and alternatives for the proposed anesthesia with the patient or authorized representative who has indicated his/her understanding and acceptance.     Dental advisory given  Plan Discussed with: CRNA and Surgeon  Anesthesia Plan Comments: (Discussed risks of anesthesia with patient, including possibility of difficulty with spontaneous ventilation under anesthesia necessitating airway intervention, PONV, and rare risks such as cardiac or respiratory or neurological events, and allergic reactions. Discussed the role of CRNA in patient's perioperative care. Patient understands. Patient informed about increased incidence of above perioperative risk due to high BMI. Patient understands.  )         Anesthesia Quick Evaluation

## 2022-03-27 NOTE — Transfer of Care (Signed)
Immediate Anesthesia Transfer of Care Note  Patient: Kristen Shepherd  Procedure(s) Performed: COLONOSCOPY WITH PROPOFOL  Patient Location: PACU and Endoscopy Unit  Anesthesia Type:General  Level of Consciousness: drowsy  Airway & Oxygen Therapy: Patient Spontanous Breathing  Post-op Assessment: Report given to RN and Post -op Vital signs reviewed and stable  Post vital signs: Reviewed and stable  Last Vitals:  Vitals Value Taken Time  BP 77/33 03/27/22 1418  Temp 35.8 C 03/27/22 1416  Pulse 99 03/27/22 1419  Resp 25 03/27/22 1419  SpO2 100 % 03/27/22 1419  Vitals shown include unvalidated device data.  Last Pain:  Vitals:   03/27/22 1416  TempSrc: Temporal  PainSc: Asleep         Complications: No notable events documented.

## 2022-03-27 NOTE — H&P (Signed)
Outpatient short stay form Pre-procedure 03/27/2022  Kristen Rubenstein, MD  Primary Physician: Dionicia Abler, PA-C  Reason for visit:  Screening colonoscopy  History of present illness:    46 y/o lady with history of obesity and hypertension here for index screening colonoscopy. No blood thinners. No family history of GI malignancies. History of hysterectomy.    Current Facility-Administered Medications:    0.9 %  sodium chloride infusion, , Intravenous, Continuous, Blaiden Werth, Hilton Cork, MD, Last Rate: 20 mL/hr at 03/27/22 1339, Continued from Pre-op at 03/27/22 1339  Medications Prior to Admission  Medication Sig Dispense Refill Last Dose   amLODipine (NORVASC) 10 MG tablet Take 1 tablet by mouth daily.   Past Week   B Complex-C (SUPER B COMPLEX PO) Take 1 tablet by mouth daily.   Past Month   cholecalciferol (VITAMIN D3) 25 MCG (1000 UNIT) tablet Take 1,000 Units by mouth daily.   Past Month   hydrochlorothiazide (HYDRODIURIL) 25 MG tablet Take 1 tablet by mouth daily.   03/27/2022 at 0600   losartan (COZAAR) 50 MG tablet Take 50 mg by mouth daily.   03/27/2022 at 0600   Multiple Vitamin (MULTIVITAMIN WITH MINERALS) TABS tablet Take 1 tablet by mouth daily. One-A-Day for Women   Past Month   Fezolinetant (VEOZAH) 45 MG TABS Take 1 tablet by mouth daily. (Patient not taking: Reported on 03/27/2022) 90 tablet 1 Not Taking   lisinopril (ZESTRIL) 20 MG tablet Take 20 mg by mouth every morning. (Patient not taking: Reported on 03/27/2022)   Not Taking     Allergies  Allergen Reactions   Meloxicam Other (See Comments)    Felt like having a heart attack      Past Medical History:  Diagnosis Date   Arthritis    knees, elbows, shoulders, ankles, wrists   DUB (dysfunctional uterine bleeding) 6.22.12   Endometrial polyp 6.22.12   Headache    Hypertension    IUD 11/24/10   MIRENA INSERTED 11/24/10   Vitamin D deficiency     Review of systems:  Otherwise negative.    Physical  Exam  Gen: Alert, oriented. Appears stated age.  HEENT: PERRLA. Lungs: No respiratory distress CV: RRR Abd: soft, benign, no masses Ext: No edema   Planned procedures: Proceed with colonoscopy. The patient understands the nature of the planned procedure, indications, risks, alternatives and potential complications including but not limited to bleeding, infection, perforation, damage to internal organs and possible oversedation/side effects from anesthesia. The patient agrees and gives consent to proceed.  Please refer to procedure notes for findings, recommendations and patient disposition/instructions.     Kristen Rubenstein, MD [facility]

## 2022-03-27 NOTE — Anesthesia Postprocedure Evaluation (Signed)
Anesthesia Post Note  Patient: Kristen Shepherd  Procedure(s) Performed: COLONOSCOPY WITH PROPOFOL  Patient location during evaluation: Endoscopy Anesthesia Type: General Level of consciousness: awake and alert Pain management: pain level controlled Vital Signs Assessment: post-procedure vital signs reviewed and stable Respiratory status: spontaneous breathing, nonlabored ventilation, respiratory function stable and patient connected to nasal cannula oxygen Cardiovascular status: blood pressure returned to baseline and stable Postop Assessment: no apparent nausea or vomiting Anesthetic complications: no   No notable events documented.   Last Vitals:  Vitals:   03/27/22 1301 03/27/22 1416  BP: (!) 159/96 115/70  Pulse: 93   Resp: 14   Temp: (!) 35.6 C (!) 35.8 C  SpO2: 99%     Last Pain:  Vitals:   03/27/22 1416  TempSrc: Temporal  PainSc: Asleep                 Arita Miss

## 2022-03-27 NOTE — Interval H&P Note (Signed)
History and Physical Interval Note:  03/27/2022 1:53 PM  Kristen Shepherd  has presented today for surgery, with the diagnosis of colon cancer screening.  The various methods of treatment have been discussed with the patient and family. After consideration of risks, benefits and other options for treatment, the patient has consented to  Procedure(s): COLONOSCOPY WITH PROPOFOL (N/A) as a surgical intervention.  The patient's history has been reviewed, patient examined, no change in status, stable for surgery.  I have reviewed the patient's chart and labs.  Questions were answered to the patient's satisfaction.     Lesly Rubenstein  Ok to proceed with colonoscopy

## 2022-03-28 ENCOUNTER — Encounter: Payer: Self-pay | Admitting: Gastroenterology

## 2022-12-04 IMAGING — MR MR FOOT*R* W/O CM
4 of 5 series · 13 of 40 positions shown · non-contrast
Comparison: None.

CLINICAL DATA: Foot trauma, medial pain

EXAM:
MRI OF THE RIGHT FOREFOOT WITHOUT CONTRAST
TECHNIQUE: Multiplanar, multisequence MR imaging of the right foot was
performed. No intravenous contrast was administered.

[Series 3: PD fat-sat · axial · right · 3.0mm · 0.25mm/px · z∈[-112,-12]mm · 4 of 30 slices shown]
[im 1/30]
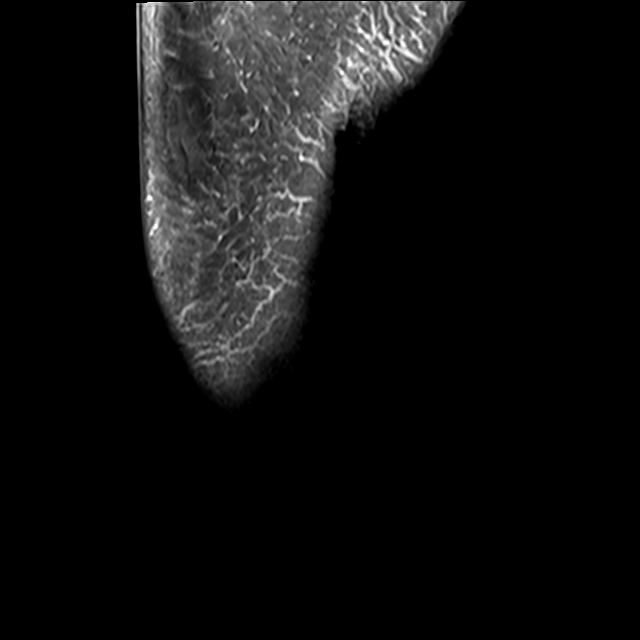
[im 4/30]
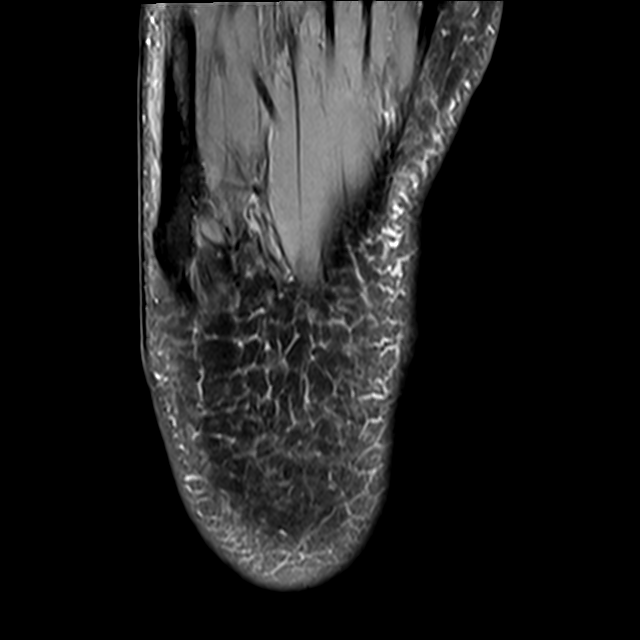
[im 15/30]
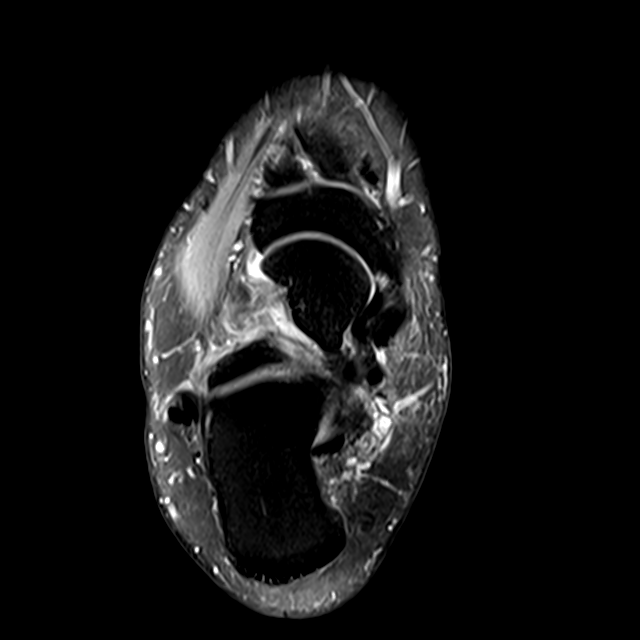
[im 26/30]
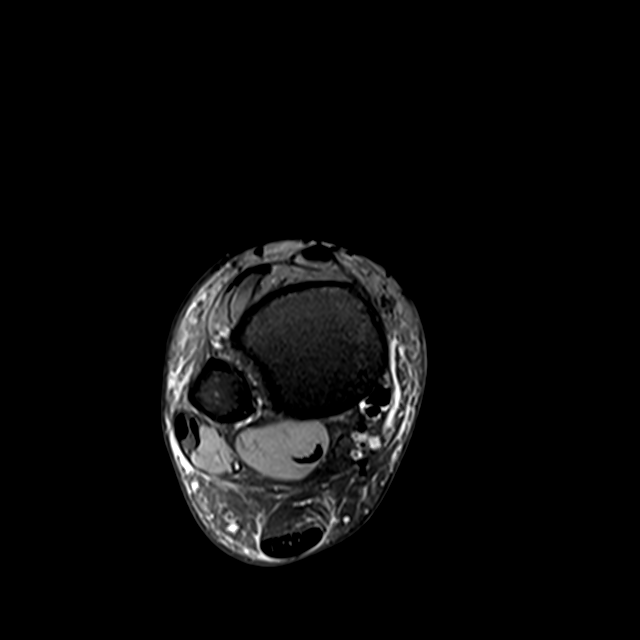

[Series 4: T2 fat-sat · axial · right · 3.0mm · 0.25mm/px · z∈[-100,-12]mm · 3 of 30 slices shown (1 of 2)]
[im 4/30]
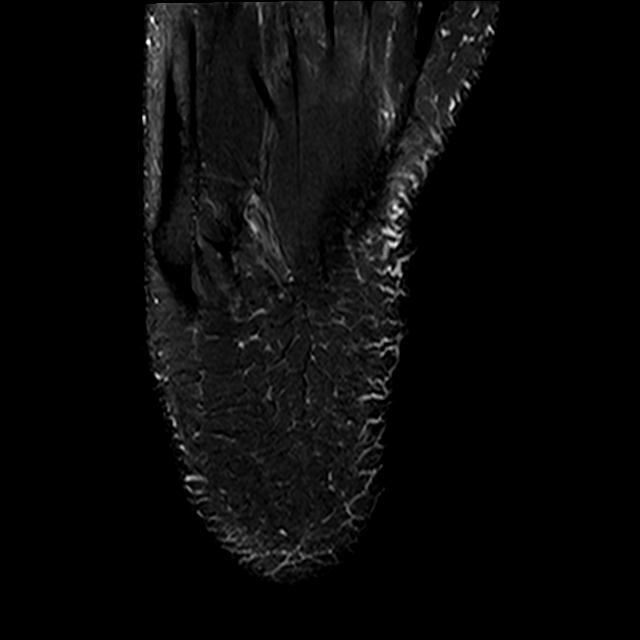
[im 15/30]
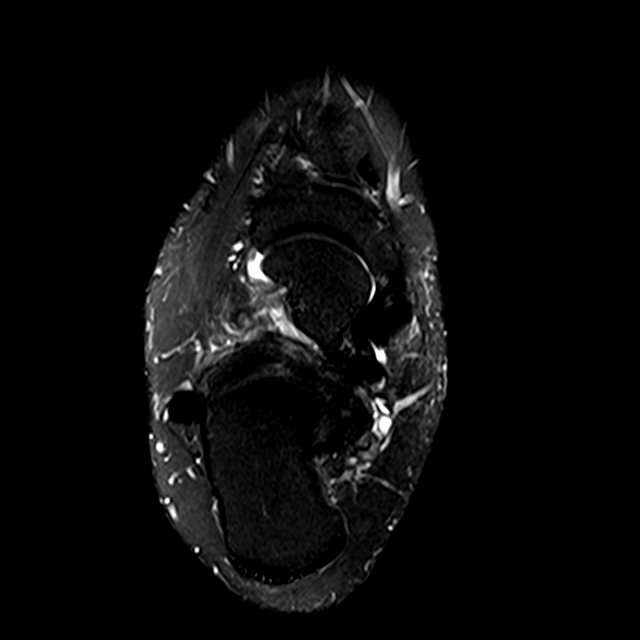
[im 26/30]
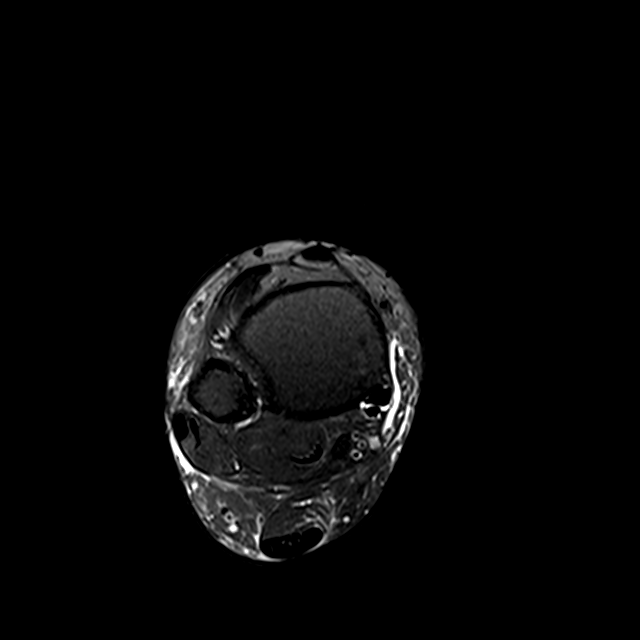

[Series 5: T1 · sagittal · right · 4.0mm · 0.27mm/px · 3 of 21 slices shown]
[im 5/21]
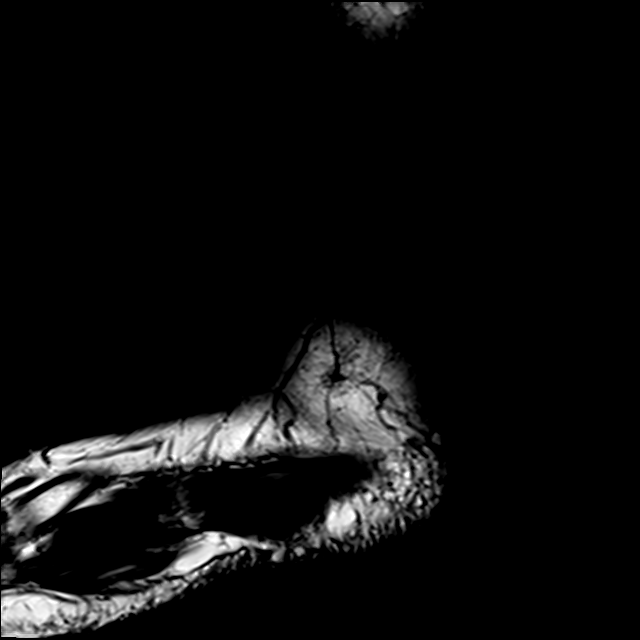
[im 13/21]
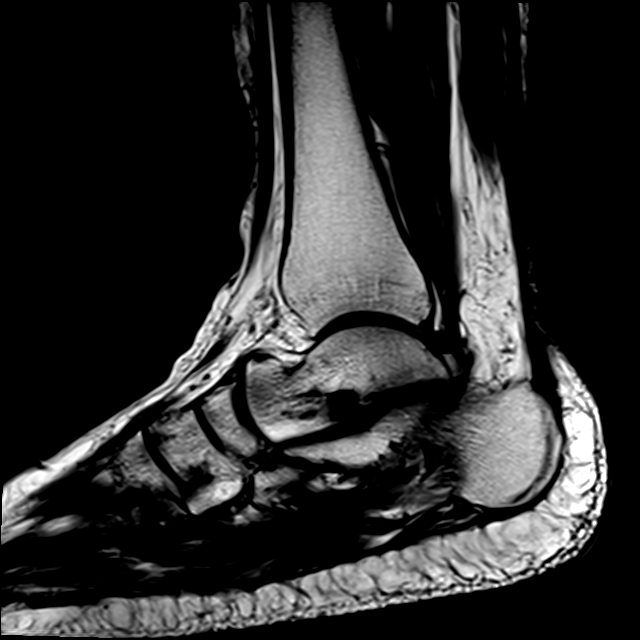
[im 21/21]
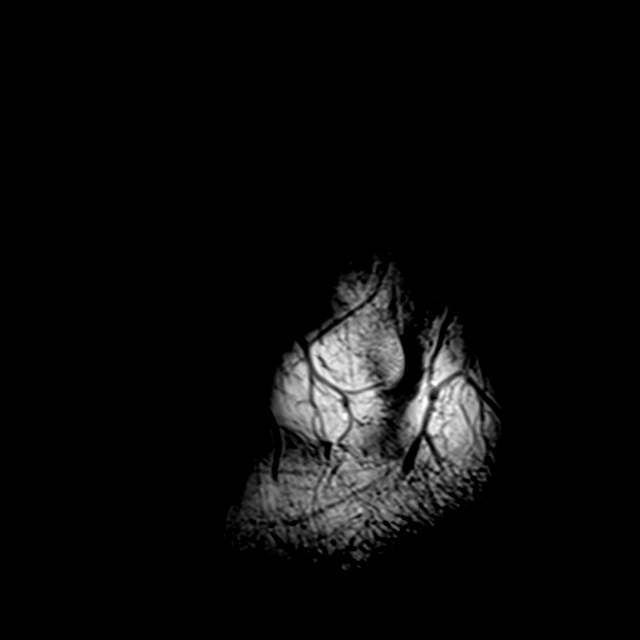

[Series 7: T2 fat-sat · coronal · right · 3.0mm · 0.25mm/px · 3 of 36 slices shown (2 of 2)]
[im 4/36]
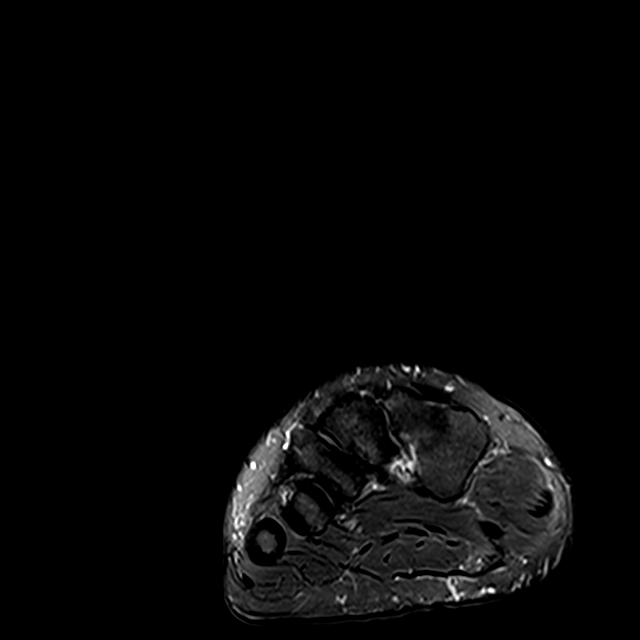
[im 20/36]
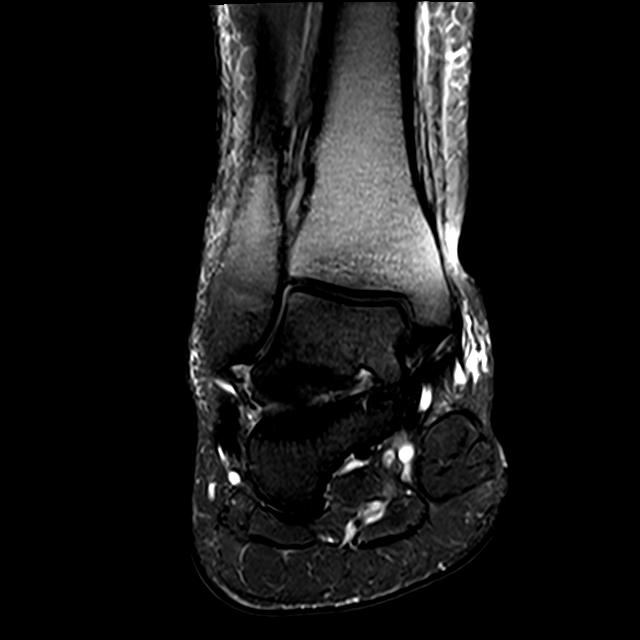
[im 32/36]
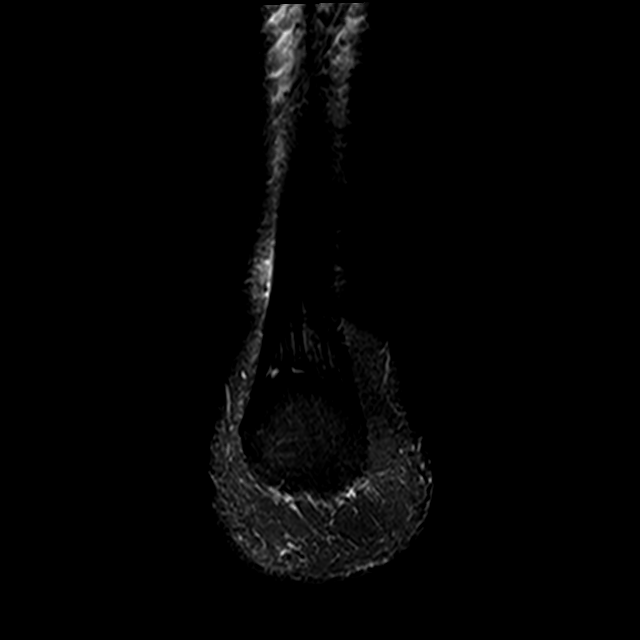

[13 of 40 positions shown; findings below may reference images not displayed]

FINDINGS: TENDONS

Peroneal: Peroneus longus is intact. Mild tendinosis of the peroneus
brevis.

Posteromedial: Mild tendinosis of the posterior tibial tendon with
mild tenosynovitis. Flexor hallucis longus tendon intact. Flexor
digitorum longus tendon intact.

Anterior: Tibialis anterior tendon intact. Extensor hallucis longus
tendon intact Extensor digitorum longus tendon intact.

Achilles:  Intact.

Plantar Fascia: Intact.

LIGAMENTS

Lateral: Anterior talofibular ligament intact. Calcaneofibular
ligament intact. Posterior talofibular ligament intact. Anterior and
posterior tibiofibular ligaments intact.

Medial: Deltoid ligament intact. Spring ligament intact.

CARTILAGE

Ankle Joint: No joint effusion. Normal ankle mortise. No chondral
defect.

Subtalar Joints/Sinus Tarsi: Normal subtalar joints. No subtalar
joint effusion. Normal sinus tarsi.

Bones: No marrow signal abnormality. No fracture or dislocation.
Mild osteoarthritis of the second TMT joint.

Soft Tissue: No fluid collection or hematoma. Muscles are normal
without edema or atrophy. Tarsal tunnel is normal.
IMPRESSION: 1. Mild tendinosis of the posterior tibial tendon with mild
tenosynovitis.
2. Mild tendinosis of the peroneus brevis.

## 2023-03-08 ENCOUNTER — Other Ambulatory Visit: Payer: Self-pay | Admitting: Family Medicine

## 2023-03-08 DIAGNOSIS — Z1231 Encounter for screening mammogram for malignant neoplasm of breast: Secondary | ICD-10-CM

## 2023-03-19 ENCOUNTER — Encounter: Payer: Self-pay | Admitting: Occupational Therapy

## 2023-03-19 ENCOUNTER — Ambulatory Visit: Payer: 59 | Attending: Orthopedic Surgery | Admitting: Occupational Therapy

## 2023-03-19 DIAGNOSIS — L905 Scar conditions and fibrosis of skin: Secondary | ICD-10-CM | POA: Diagnosis present

## 2023-03-19 DIAGNOSIS — M25642 Stiffness of left hand, not elsewhere classified: Secondary | ICD-10-CM | POA: Insufficient documentation

## 2023-03-19 DIAGNOSIS — M6281 Muscle weakness (generalized): Secondary | ICD-10-CM | POA: Insufficient documentation

## 2023-03-19 DIAGNOSIS — M25632 Stiffness of left wrist, not elsewhere classified: Secondary | ICD-10-CM | POA: Insufficient documentation

## 2023-03-19 NOTE — Therapy (Signed)
OUTPATIENT OCCUPATIONAL THERAPY ORTHO EVALUATION  Patient Name: Kristen Shepherd MRN: [id number] DOB:1975-06-07, 47 y.o., female Today's Date: 03/19/2023  PCP: Shelton Silvas PA REFERRING PROVIDER: Dr Rosita Kea  END OF SESSION:  OT End of Session - 03/19/23 0929     Visit Number 1    Number of Visits 10    Date for OT Re-Evaluation 04/30/23    OT Start Time 0820    OT Stop Time 0908    OT Time Calculation (min) 48 min    Activity Tolerance Patient tolerated treatment well    Behavior During Therapy [facility] for tasks assessed/performed             Past Medical History:  Diagnosis Date   Arthritis    knees, elbows, shoulders, ankles, wrists   DUB (dysfunctional uterine bleeding) 6.22.12   Endometrial polyp 6.22.12   Headache    Hypertension    IUD 11/24/10   MIRENA INSERTED 11/24/10   Vitamin D deficiency    Past Surgical History:  Procedure Laterality Date   COLONOSCOPY WITH PROPOFOL N/A 03/27/2022   Procedure: COLONOSCOPY WITH PROPOFOL;  Surgeon: Regis Bill, MD;  Location: [facility] END[facility];  Service: Endoscopy;  Laterality: N/A;   HYSTEROSCOPY  6.22.12   polyp and dub   INTRAUTERINE DEVICE INSERTION  02/05/2019   MIRENA   ROBOTIC ASSISTED LAPAROSCOPIC HYSTERECTOMY AND SALPINGECTOMY Bilateral 04/21/2020   Procedure: XI ROBOTIC ASSISTED LAPAROSCOPIC TOTAL HYSTERECTOMY AND SALPINGECTOMY;  Surgeon: Theresia Majors, MD;  Location: [facility] OR;  Service: Gynecology;  Laterality: Bilateral;   Patient Active Problem List   Diagnosis Date Noted   S/P hysterectomy 04/21/2020   Essential hypertension 02/19/2020   Morbid (severe) obesity due to excess calories (HCC) 02/19/2020   Osteoarthrosis, unspecified whether generalized or localized, lower leg 02/19/2020   Hypertensive urgency 01/21/2020   Localized osteoarthritis of left knee 12/17/2018   Left elbow tendonitis 11/06/2018   Cervical spondylosis with radiculopathy 09/25/2018   Abnormal x-ray of cervical spine 06/27/2018    Radicular pain in left arm 06/13/2018   Knee pain 08/07/2016   ANA positive 03/08/2016   Pain in unspecified joint 03/08/2016   Vitamin D deficiency 03/08/2016   Recurrent headache 02/24/2015   Weight gain 12/06/2011    ONSET DATE: 01/24/23  REFERRING DIAG: Bilateral CTR  THERAPY DIAG:  Scar condition and fibrosis of skin  Stiffness of left hand, not elsewhere classified  Stiffness of left wrist, not elsewhere classified  Muscle weakness (generalized)  Rationale for Evaluation and Treatment: Rehabilitation  SUBJECTIVE:   SUBJECTIVE STATEMENT: Had my R hand done in May and L hand Sept - numbness is better but having discomfort with use and shooting /radiating pain in palm , middle finger and thumb with use  Pt accompanied by: self  PERTINENT HISTORY: 03/08/23 Ortho note: The patient presents for clinical exam and determination of her work status post left carpal tunnel release on 01/24/2023. Her sutures were removed on 02/07/2023. She continues to experience pain in her palm and fingertips, although her numbness has improved. She reports difficulty using her left hand, particularly with gripping. Additionally, she has begun to experience radiating sensation in the middle of her hand. Activities such as squeezing a towel after showering and holding the steering wheel while driving exacerbate her symptoms.  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 She notes that her job does not offer light duty.  Refer to OT   PRECAUTIONS: None  WEIGHT BEARING RESTRICTIONS: No  PAIN:  Are you having pain? 6/10 thumb opposition L  FALLS: Has patient fallen in last 6 months? 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  HAND DOMINANCE: Right  UPPER EXTREMITY ROM:     Active ROM Right eval Left eval  Shoulder flexion    Shoulder abduction    Shoulder adduction    Shoulder extension    Shoulder internal rotation    Shoulder external rotation    Elbow flexion    Elbow extension    Wrist flexion 90 90  Wrist extension 55 60 pull  Wrist ulnar deviation    Wrist radial deviation    Wrist pronation    Wrist supination    (Blank rows = not tested)  Active ROM Right eval Left eval  Thumb MCP (0-60)    Thumb IP (0-80)    Thumb Radial abd/add (0-55)   60  Thumb Palmar abd/add (0-45)  42 pain 6/10    Thumb Opposition to Small Finger  Pain opposition to 4th    Index MCP (0-90)  70   Index PIP (0-100)   100  Index DIP (0-70)      Long MCP (0-90)   75   Long PIP (0-100)   100   Long DIP (0-70)      Ring MCP (0-90)   80   Ring PIP (0-100)    95  Ring DIP (0-70)      Little MCP (0-90)    90  Little PIP (0-100)    95  Little DIP (0-70)      (Blank rows = not tested)     HAND FUNCTION: Grip strength: Right: 60 lbs; Left: 25 lbs, Lateral pinch: Right: 16 lbs, Left: 8 lbs, and 3 point pinch: Right: 12 lbs, Left: 4 lbs  pain in L hand   SENSATION: Report numbness improved  EDEMA: over L CT incision base of palm   COGNITION: Overall cognitive status: Within functional limits for tasks assessed       TODAY'S TREATMENT:                                                                                                                              DATE: 03/19/23 Right hand patient to do scar massage as well as soft tissue massage to webspace as well as carpal spread prior to thumb palmar radial abduction Prayer stretch for wrist extension Green medium putty for gripping as well as lateral and 3-point pinch 2 times a day 12 reps symptom-free Can increase gradually over the week to  a second and third  set symptom-free Left hand contrast 2 times a day to 3 times prior to soft tissue massage to webspace as well as carpal spreads. Patient educated on scar massage as well as Cica -Care scar pad for nighttime provided for both scars Followed by tendon glides, thumb palmar radial abduction and opposition symptom-free 12 reps Prayer stretch for wrist extension on the left .   PATIENT EDUCATION: Education details: findings of eval and HEP  Person educated: Patient Education method: Explanation, Demonstration, Tactile cues, Verbal cues, and Handouts Education comprehension: verbalized understanding, returned demonstration, verbal cues required, and needs further education   GOALS: Goals reviewed with patient? Yes  LONG TERM GOALS: Target date: 6 wks  Patient be independent in the home program to decrease pain in left hand to less than 2/10 with use. Baseline: Pain increases in the left palm and hand 6/10 with functional use Goal status: INITIAL  2.  Bilateral wrist extension improved to within normal limits for patient to push and pull heavy door as well as turn doorknob without increased symptoms Baseline: Increase symptoms to 6/10 with wrist extension as well as radial abduction of thumb.  Decreased wrist extension 55 to 60 degrees. Goal status: INITIAL  3.  Bilateral thumb active range of motion and strength increased within normal limits for patient to turn doorknob as well as squeeze washcloth cut with a knife Baseline: Increased pain with radial abduction.  Bilateral palmar abduction decreased to 42 degrees on the left with tenderness in webspace pain increased to 3-6/10 with use Goal status: INITIAL  4.  Grip and prehension strength increased on the left within functional limits for her age to be able to return back to work Baseline: Right grip 60 and left 25 pounds, lateral pinch on the right 16 left 8 pounds and 3-point pinch in the right 12 pounds and left 4  pounds with increased symptoms Goal status: INITIAL  ASSESSMENT:  CLINICAL IMPRESSION: Patient seen today for occupational therapy evaluation for bilateral carpal tunnel surgery with the right in May 24 and left September 24.  Patient with decreased wrist extension bilateral hands as well as radial and palmar abduction discomfort and pain increased to 6/10.  Patient with increased scar tissue as well as edema on the left more than the right.  With radiating pain with functional use involving gripping and pinching.  As well as increased tenderness.  Patient showed decreased grip and prehension strength on the left more than the right.  Or limiting functional use of bilateral hands in ADLs and IADLs.  Patient can benefit from skilled OT services to decrease scar tissue and pain and edema increase motion and strength to return to prior level of function as well as return back to work.  PERFORMANCE DEFICITS: in functional skills including ADLs, IADLs, ROM, strength, pain, flexibility, decreased knowledge of use of DME, and UE functional use,   and psychosocial skills including environmental adaptation and routines and behaviors.   IMPAIRMENTS: are limiting patient from ADLs, IADLs, rest and sleep, play, leisure, and social participation.   COMORBIDITIES: has no other co-morbidities that affects occupational performance. Patient will benefit from skilled OT to address above impairments and improve overall function.  MODIFICATION OR ASSISTANCE TO COMPLETE EVALUATION: No modification of tasks or assist necessary to complete an evaluation.  OT OCCUPATIONAL PROFILE AND HISTORY: Problem focused assessment: Including review of records relating to presenting problem.  CLINICAL DECISION MAKING: LOW - limited treatment options, no task modification necessary  REHAB POTENTIAL: Good for  goals  EVALUATION COMPLEXITY: Low   PLAN:  OT FREQUENCY: 2x week and decrease to  1x wk as progress   OT DURATION: 6  weeks PLANNING: Splinting;Paraffin;Fluidtherapy;Contrast Bath;DME and/or AE instruction;Manual Therapy;Passive range of motion;Scar mobilization;Therapeutic activities, Ultrasound, Iontophoresis, There ex, pt education     Oletta Cohn, OTR/L,CLT 03/19/2023, 9:33 AM

## 2023-03-20 ENCOUNTER — Ambulatory Visit
Admission: RE | Admit: 2023-03-20 | Discharge: 2023-03-20 | Disposition: A | Discharge status: Home / Self Care | Payer: 59 | Source: Ambulatory Visit | Attending: Family Medicine | Admitting: Family Medicine

## 2023-03-20 DIAGNOSIS — Z1231 Encounter for screening mammogram for malignant neoplasm of breast: Secondary | ICD-10-CM | POA: Diagnosis present

## 2023-03-26 ENCOUNTER — Ambulatory Visit: Payer: 59 | Attending: Orthopedic Surgery | Admitting: Occupational Therapy

## 2023-03-26 DIAGNOSIS — M25632 Stiffness of left wrist, not elsewhere classified: Secondary | ICD-10-CM

## 2023-03-26 DIAGNOSIS — M6281 Muscle weakness (generalized): Secondary | ICD-10-CM | POA: Diagnosis present

## 2023-03-26 DIAGNOSIS — L905 Scar conditions and fibrosis of skin: Secondary | ICD-10-CM

## 2023-03-26 DIAGNOSIS — M25642 Stiffness of left hand, not elsewhere classified: Secondary | ICD-10-CM | POA: Diagnosis present

## 2023-03-26 NOTE — Therapy (Addendum)
OUTPATIENT OCCUPATIONAL THERAPY ORTHO Treatment   Patient Name: Kristen Shepherd MRN: [id number] DOB:08/03/1975, 47 y.o., female Today's Date: 03/26/2023  PCP: Shelton Silvas PA REFERRING PROVIDER: Dr Rosita Kea  END OF SESSION:  OT End of Session - 03/26/23 1005     Visit Number 2    Number of Visits 10    Date for OT Re-Evaluation 04/30/23    OT Start Time 0945    OT Stop Time 1028    OT Time Calculation (min) 43 min    Activity Tolerance Patient tolerated treatment well    Behavior During Therapy [facility] for tasks assessed/performed             Past Medical History:  Diagnosis Date   Arthritis    knees, elbows, shoulders, ankles, wrists   DUB (dysfunctional uterine bleeding) 6.22.12   Endometrial polyp 6.22.12   Headache    Hypertension    IUD 11/24/10   MIRENA INSERTED 11/24/10   Vitamin D deficiency    Past Surgical History:  Procedure Laterality Date   COLONOSCOPY WITH PROPOFOL N/A 03/27/2022   Procedure: COLONOSCOPY WITH PROPOFOL;  Surgeon: Regis Bill, MD;  Location: [facility] ENDOSCOPY;  Service: Endoscopy;  Laterality: N/A;   HYSTEROSCOPY  6.22.12   polyp and dub   INTRAUTERINE DEVICE INSERTION  02/05/2019   MIRENA   ROBOTIC ASSISTED LAPAROSCOPIC HYSTERECTOMY AND SALPINGECTOMY Bilateral 04/21/2020   Procedure: XI ROBOTIC ASSISTED LAPAROSCOPIC TOTAL HYSTERECTOMY AND SALPINGECTOMY;  Surgeon: Theresia Majors, MD;  Location: [facility] OR;  Service: Gynecology;  Laterality: Bilateral;   Patient Active Problem List   Diagnosis Date Noted   S/P hysterectomy 04/21/2020   Essential hypertension 02/19/2020   Morbid (severe) obesity due to excess calories (HCC) 02/19/2020   Osteoarthrosis, unspecified whether generalized or localized, lower leg 02/19/2020   Hypertensive urgency 01/21/2020   Localized osteoarthritis of left knee 12/17/2018   Left elbow tendonitis 11/06/2018   Cervical spondylosis with radiculopathy 09/25/2018   Abnormal x-ray of cervical spine 06/27/2018    Radicular pain in left arm 06/13/2018   Knee pain 08/07/2016   ANA positive 03/08/2016   Pain in unspecified joint 03/08/2016   Vitamin D deficiency 03/08/2016   Recurrent headache 02/24/2015   Weight gain 12/06/2011    ONSET DATE: 01/24/23  REFERRING DIAG: Bilateral CTR  THERAPY DIAG:  Scar condition and fibrosis of skin  Stiffness of left wrist, not elsewhere classified  Muscle weakness (generalized)  Stiffness of left hand, not elsewhere classified  Rationale for Evaluation and Treatment: Rehabilitation  SUBJECTIVE:   SUBJECTIVE STATEMENT: No numbness anymore - more discomfort than pain and some swelling on the L still - husband helped with massages and motion is better- using it more  Pt accompanied by: self  PERTINENT HISTORY: 03/08/23 Ortho note: The patient presents for clinical exam and determination of her work status post left carpal tunnel release on 01/24/2023. Her sutures were removed on 02/07/2023. She continues to experience pain in her palm and fingertips, although her numbness has improved. She reports difficulty using her left hand, particularly with gripping. Additionally, she has begun to experience radiating sensation in the middle of her hand. Activities such as squeezing a towel after showering and holding the steering wheel while driving exacerbate her symptoms.  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 She notes that her job does not offer light duty.  Refer to OT   PRECAUTIONS: None     WEIGHT BEARING RESTRICTIONS:  No  PAIN:  Are you having pain? 1-2/10 thumb depending on what doing   FALLS: Has patient fallen in last 6 months? 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  HAND DOMINANCE: Right  UPPER EXTREMITY ROM:     Active ROM Right eval Left eval R/L  03/26/23  Shoulder flexion     Shoulder abduction     Shoulder adduction     Shoulder extension     Shoulder internal rotation     Shoulder external rotation     Elbow flexion     Elbow extension     Wrist flexion 90 90   Wrist extension 55 60 pull 64/75  Wrist ulnar deviation     Wrist radial deviation     Wrist pronation     Wrist supination     (Blank rows = not tested)  Active ROM Right eval Left eval L 03/26/23  Thumb MCP (0-60)     Thumb IP (0-80)     Thumb Radial abd/add (0-55)   60 50 light pull in palm   Thumb Palmar abd/add (0-45)  42 pain 6/10   70  Thumb Opposition to Small Finger  Pain opposition to 4th   Opposition to 5th and sliding down   Index MCP (0-90)  70  80  Index PIP (0-100)   100 100  Index DIP (0-70)       Long MCP (0-90)   75  85  Long PIP (0-100)   100  100  Long DIP (0-70)       Ring MCP (0-90)   80  90  Ring PIP (0-100)    95 100  Ring DIP (0-70)       Little MCP (0-90)    90 90  Little PIP (0-100)    95 100  Little DIP (0-70)       (Blank rows = not tested)     HAND FUNCTION: Grip strength: Right: 60 lbs; Left: 25 lbs, Lateral pinch: Right: 16 lbs, Left: 8 lbs, and 3 point pinch: Right: 12 lbs, Left: 4 lbs  pain in L hand   03/26/23:  Grip strength: Right: 64 lbs; Left: 32 lbs, Lateral pinch: Right: 18 lbs, Left: 15 lbs, and 3 point pinch: Right: 12 lbs, Left: 10 lbs SENSATION: Report numbness improved  EDEMA: over L CT incision base of palm   COGNITION: Overall cognitive status: Within functional limits for tasks assessed       TODAY'S TREATMENT:  DATE: 03/26/23 Great progress in digits , thumb and wrist  AROM  Decrease pain - and increase functional use since eval  Fluidotherapy to bilateral hands and wrist prior to scar massage - mobs using mini massager and graston tool nr 2 brushing  Followed by Webspace and MC spreads Cont at home contrast on L and heat on R  And soft tissue and scar mobs- scar apd  REview Prayer stretch for wrist extension Add and review - table slides 20 reps - 3 x day - symptom free Cont Green medium putty for gripping as well as lateral and 3-point pinch 2 times a day 12 reps symptom-free R hand Cont L hand  tendon glides, thumb palmar /radial abduction and opposition symptom-free 12 reps Med N glide 5 reps to each hand 3x day -    PATIENT EDUCATION: Education details: findings of eval and HEP  Person educated: Patient Education method: Explanation, Demonstration, Tactile cues, Verbal cues, and Handouts Education comprehension: verbalized understanding, returned demonstration, verbal cues required, and needs further education   GOALS: Goals reviewed with patient? Yes  LONG TERM GOALS: Target date: 6 wks  Patient be independent in the home program to decrease pain in left hand to less than 2/10 with use. Baseline: Pain increases in the left palm and hand 6/10 with functional use Goal status: INITIAL  2.  Bilateral wrist extension improved to within normal limits for patient to push and pull heavy door as well as turn doorknob without increased symptoms Baseline: Increase symptoms to 6/10 with wrist extension as well as radial abduction of thumb.  Decreased wrist extension 55 to 60 degrees. Goal status: INITIAL  3.  Bilateral thumb active range of motion and strength increased within normal limits for patient to turn doorknob as well as squeeze washcloth cut with a knife Baseline: Increased pain with radial abduction.  Bilateral palmar abduction decreased to 42 degrees on the left with tenderness in webspace pain increased to 3-6/10 with use Goal status:  INITIAL  4.  Grip and prehension strength increased on the left within functional limits for her age to be able to return back to work Baseline: Right grip 60 and left 25 pounds, lateral pinch on the right 16 left 8 pounds and 3-point pinch in the right 12 pounds and left 4 pounds with increased symptoms Goal status: INITIAL  ASSESSMENT:  CLINICAL IMPRESSION: Patient seen for occupational therapy evaluation for bilateral carpal tunnel surgery with the right in May 24 and left September 24.  Patient follow up today after eval last week -made great progress in pain , digits , thumb and wrist AROM - discomfort was 6/10 now 1-2/10 Pt cont to have some increased scar tissue as well as edema on the left more than the right.  Cont with soft tissue mobs and scar mobs- - great progress in gripping and pinching.  Cont to be  limited in functional use of bilateral hands in ADLs and IADLs.  Patient can benefit from skilled OT services to decrease scar tissue and pain and edema increase motion and strength to return to prior level of function as well as return back to work.  PERFORMANCE DEFICITS: in functional skills including ADLs, IADLs, ROM, strength, pain, flexibility, decreased knowledge of use of DME, and UE functional use,   and psychosocial skills including environmental adaptation and routines and behaviors.   IMPAIRMENTS: are limiting patient from ADLs, IADLs, rest and sleep, play, leisure, and social participation.   COMORBIDITIES: has no other  co-morbidities that affects occupational performance. Patient will benefit from skilled OT to address above impairments and improve overall function.  MODIFICATION OR ASSISTANCE TO COMPLETE EVALUATION: No modification of tasks or assist necessary to complete an evaluation.  OT OCCUPATIONAL PROFILE AND HISTORY: Problem focused assessment: Including review of records relating to presenting problem.  CLINICAL DECISION MAKING: LOW - limited treatment options,  no task modification necessary  REHAB POTENTIAL: Good for goals  EVALUATION COMPLEXITY: Low   PLAN:  OT FREQUENCY: 2x week and decrease to  1x wk as progress   OT DURATION: 6 weeks PLANNING: Splinting;Paraffin;Fluidtherapy;Contrast Bath;DME and/or AE instruction;Manual Therapy;Passive range of motion;Scar mobilization;Therapeutic activities, Ultrasound, Iontophoresis, There ex, pt education     Oletta Cohn, OTR/L,CLT 03/26/2023, 10:33 AM

## 2023-03-29 ENCOUNTER — Ambulatory Visit: Payer: 59 | Admitting: Occupational Therapy

## 2023-03-29 DIAGNOSIS — M25632 Stiffness of left wrist, not elsewhere classified: Secondary | ICD-10-CM

## 2023-03-29 DIAGNOSIS — M25642 Stiffness of left hand, not elsewhere classified: Secondary | ICD-10-CM

## 2023-03-29 DIAGNOSIS — M6281 Muscle weakness (generalized): Secondary | ICD-10-CM

## 2023-03-29 DIAGNOSIS — L905 Scar conditions and fibrosis of skin: Secondary | ICD-10-CM

## 2023-03-29 NOTE — Therapy (Signed)
OUTPATIENT OCCUPATIONAL THERAPY ORTHO Treatment   Patient Name: Kristen Shepherd MRN: [id number] DOB:[birth year], 47 y.o., female Today's Date: 03/29/2023  PCP: Shelton Silvas PA REFERRING PROVIDER: Dr Rosita Kea  END OF SESSION:  OT End of Session - 03/29/23 2106     Visit Number 3    Number of Visits 10    Date for OT Re-Evaluation 04/30/23    OT Start Time 0946    OT Stop Time 1030    OT Time Calculation (min) 44 min    Activity Tolerance Patient tolerated treatment well    Behavior During Therapy [facility] for tasks assessed/performed             Past Medical History:  Diagnosis Date   Arthritis    knees, elbows, shoulders, ankles, wrists   DUB (dysfunctional uterine bleeding) 6.22.12   Endometrial polyp 6.22.12   Headache    Hypertension    IUD 11/24/10   MIRENA INSERTED 11/24/10   Vitamin D deficiency    Past Surgical History:  Procedure Laterality Date   COLONOSCOPY WITH PROPOFOL N/A 03/27/2022   Procedure: COLONOSCOPY WITH PROPOFOL;  Surgeon: Regis Bill, MD;  Location: [facility] ENDOSCOPY;  Service: Endoscopy;  Laterality: N/A;   HYSTEROSCOPY  6.22.12   polyp and dub   INTRAUTERINE DEVICE INSERTION  02/05/2019   MIRENA   ROBOTIC ASSISTED LAPAROSCOPIC HYSTERECTOMY AND SALPINGECTOMY Bilateral 04/21/2020   Procedure: XI ROBOTIC ASSISTED LAPAROSCOPIC TOTAL HYSTERECTOMY AND SALPINGECTOMY;  Surgeon: Theresia Majors, MD;  Location: [facility] OR;  Service: Gynecology;  Laterality: Bilateral;   Patient Active Problem List   Diagnosis Date Noted   S/P hysterectomy 04/21/2020   Essential hypertension 02/19/2020   Morbid (severe) obesity due to excess calories (HCC) 02/19/2020   Osteoarthrosis, unspecified whether generalized or localized, lower leg 02/19/2020   Hypertensive urgency 01/21/2020   Localized osteoarthritis of left knee 12/17/2018   Left elbow tendonitis 11/06/2018   Cervical spondylosis with radiculopathy 09/25/2018   Abnormal x-ray of cervical spine 06/27/2018    Radicular pain in left arm 06/13/2018   Knee pain 08/07/2016   ANA positive 03/08/2016   Pain in unspecified joint 03/08/2016   Vitamin D deficiency 03/08/2016   Recurrent headache 02/24/2015   Weight gain 12/06/2011    ONSET DATE: 01/24/23  REFERRING DIAG: Bilateral CTR  THERAPY DIAG:  Scar condition and fibrosis of skin  Stiffness of left wrist, not elsewhere classified  Muscle weakness (generalized)  Stiffness of left hand, not elsewhere classified  Rationale for Evaluation and Treatment: Rehabilitation  SUBJECTIVE:   SUBJECTIVE STATEMENT: No numbness anymore - more discomfort than pain and some swelling on the pinkie side of my scar- when I use it like washing dishes , exercises - doing much better- I just notice that swelling and discomfort Pt accompanied by: self  PERTINENT HISTORY: 03/08/23 Ortho note: The patient presents for clinical exam and determination of her work status post left carpal tunnel release on 01/24/2023. Her sutures were removed on 02/07/2023. She continues to experience pain in her palm and fingertips, although her numbness has improved. She reports difficulty using her left hand, particularly with gripping. Additionally, she has begun to experience radiating sensation in the middle of her hand. Activities such as squeezing a towel after showering and holding the steering wheel while driving exacerbate her symptoms.  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 She notes that her job does not offer light duty.  Refer to OT  PRECAUTIONS: None     WEIGHT BEARING RESTRICTIONS: No  PAIN:  Are you having pain? Over scar and ulnar side - discomfort more than pain  FALLS: Has patient fallen in last 6 months? 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  HAND DOMINANCE: Right  UPPER EXTREMITY ROM:     Active ROM Right eval Left eval R/L  03/26/23 L 03/28/33  Shoulder flexion      Shoulder abduction      Shoulder adduction      Shoulder extension      Shoulder internal rotation      Shoulder external rotation      Elbow flexion      Elbow extension      Wrist flexion 90 90  90  Wrist extension 55 60 pull 64/75 80  Wrist ulnar deviation      Wrist radial deviation      Wrist pronation      Wrist supination      (Blank rows = not tested)  Active ROM Right eval Left eval L 03/26/23  Thumb MCP (0-60)     Thumb IP (0-80)     Thumb Radial abd/add (0-55)   60 50 light pull in palm   Thumb Palmar abd/add (0-45)  42 pain 6/10   70  Thumb Opposition to Small Finger  Pain opposition to 4th   Opposition to 5th and sliding down   Index MCP (0-90)  70  80  Index PIP (0-100)   100 100  Index DIP (0-70)       Long MCP (0-90)   75  85  Long PIP (0-100)   100  100  Long DIP (0-70)       Ring MCP (0-90)   80  90  Ring PIP (0-100)    95 100  Ring DIP (0-70)       Little MCP (0-90)    90 90  Little PIP (0-100)    95 100  Little DIP (0-70)       (Blank rows = not tested)     HAND FUNCTION: Grip strength: Right: 60 lbs; Left: 25 lbs, Lateral pinch: Right: 16 lbs, Left: 8 lbs, and 3 point pinch: Right: 12 lbs, Left: 4 lbs  pain in L hand   03/26/23:  Grip strength: Right: 64 lbs; Left: 32 lbs, Lateral pinch: Right: 18 lbs, Left: 15 lbs, and 3 point pinch: Right: 12 lbs, Left: 10 lbs  03/29/23 Grip R 64 lbs and L 40 lbs   SENSATION: Report numbness improved  EDEMA: over L CT incision base of palm   COGNITION: Overall cognitive status: Within functional limits for tasks assessed       TODAY'S TREATMENT:  DATE: 03/29/23 Great progress in digits , thumb and wrist AROM  Decrease pain - and increase functional use - discomfort mostly now over scar and ulnar side of scar and hypothenar eminence and increase edema per pt after use  Fluidotherapy to L hand and wrist prior to scar massage - mobs using mini massager, xtractor and graston tool nr 2 brushing  Followed by Webspace and MC spreads Cont at home contrast on L and heat on R  And soft tissue and scar mobs- scar apd  REview Prayer stretch for wrist extension review - table slides 20 reps - 3 x day - symptom free Cont Green medium putty for gripping as well as lateral and 3-point pinch 2 times a day 12 reps symptom-free R hand- but can do pinching into putty and increase sets  For L hand can start lat and 3 point pinch - but into short sausage- 12 reps  2 x day  NO grippping   Cont L hand  tendon glides, thumb palmar /radial abduction and opposition symptom-free 12 reps Med N glide 5 reps to each hand 3x day -  [place] at 3.60mhz , 20% and 1.0 over scar and ulnar side of scar end of session to decrease edema and discomfort   PATIENT EDUCATION: Education details: findings of eval and HEP  Person educated: Patient Education method: Explanation, Demonstration, Tactile cues, Verbal cues, and Handouts Education comprehension: verbalized understanding, returned demonstration, verbal cues required, and needs further education   GOALS: Goals reviewed with patient? Yes  LONG TERM GOALS: Target date: 6 wks  Patient be independent in the home program to decrease pain in left hand to less than 2/10 with use. Baseline: Pain increases in the left palm and hand 6/10 with functional use Goal status: INITIAL  2.  Bilateral wrist extension improved to within normal limits for patient to push and pull heavy door as well as turn doorknob without increased symptoms Baseline:  Increase symptoms to 6/10 with wrist extension as well as radial abduction of thumb.  Decreased wrist extension 55 to 60 degrees. Goal status: INITIAL  3.  Bilateral thumb active range of motion and strength increased within normal limits for patient to turn doorknob as well as squeeze washcloth cut with a knife Baseline: Increased pain with radial abduction.  Bilateral palmar abduction decreased to 42 degrees on the left with tenderness in webspace pain increased to 3-6/10 with use Goal status: INITIAL  4.  Grip and prehension strength increased on the left within functional limits for her age to be able to return back to work Baseline: Right grip 60 and left 25 pounds, lateral pinch on the right 16 left 8 pounds and 3-point pinch in the right 12 pounds and left 4 pounds with increased symptoms Goal status: INITIAL  ASSESSMENT:  CLINICAL IMPRESSION: Patient seen for occupational therapy evaluation for bilateral carpal tunnel surgery with the right in May 24 and left September 24.  Patient this week with great progress in pain , digits , thumb and wrist AROM - discomfort was 6/10 now 1-2/10 mostly over scar and ulnar side of scar after use or exercises.  Pt cont to have some increased scar tissue as well as edema on the ulnar side of scar on L hand.  Cont with soft tissue mobs and scar mobs- great progress in gripping and pinching. Upgrade putty HEP for pt -and functional use -  Cont to be  limited in functional use of bilateral hands in ADLs and  IADLs.  Patient can benefit from skilled OT services to decrease scar tissue and pain and edema increase motion and strength to return to prior level of function as well as return back to work.  PERFORMANCE DEFICITS: in functional skills including ADLs, IADLs, ROM, strength, pain, flexibility, decreased knowledge of use of DME, and UE functional use,   and psychosocial skills including environmental adaptation and routines and behaviors.   IMPAIRMENTS:  are limiting patient from ADLs, IADLs, rest and sleep, play, leisure, and social participation.   COMORBIDITIES: has no other co-morbidities that affects occupational performance. Patient will benefit from skilled OT to address above impairments and improve overall function.  MODIFICATION OR ASSISTANCE TO COMPLETE EVALUATION: No modification of tasks or assist necessary to complete an evaluation.  OT OCCUPATIONAL PROFILE AND HISTORY: Problem focused assessment: Including review of records relating to presenting problem.  CLINICAL DECISION MAKING: LOW - limited treatment options, no task modification necessary  REHAB POTENTIAL: Good for goals  EVALUATION COMPLEXITY: Low   PLAN:  OT FREQUENCY: 2x week and decrease to  1x wk as progress   OT DURATION: 6 weeks PLANNING: Splinting;Paraffin;Fluidtherapy;Contrast Bath;DME and/or AE instruction;Manual Therapy;Passive range of motion;Scar mobilization;Therapeutic activities, Ultrasound, Iontophoresis, There ex, pt education     Oletta Cohn, OTR/L,CLT 03/29/2023, 9:07 PM

## 2023-04-02 ENCOUNTER — Ambulatory Visit: Payer: 59 | Admitting: Occupational Therapy

## 2023-04-02 DIAGNOSIS — L905 Scar conditions and fibrosis of skin: Secondary | ICD-10-CM

## 2023-04-02 DIAGNOSIS — M25632 Stiffness of left wrist, not elsewhere classified: Secondary | ICD-10-CM

## 2023-04-02 DIAGNOSIS — M25642 Stiffness of left hand, not elsewhere classified: Secondary | ICD-10-CM

## 2023-04-02 DIAGNOSIS — M6281 Muscle weakness (generalized): Secondary | ICD-10-CM

## 2023-04-02 NOTE — Therapy (Signed)
OUTPATIENT OCCUPATIONAL THERAPY ORTHO Treatment   Patient Name: Kristen Shepherd MRN: [id number] DOB:03/24/1976, 47 y.o., female Today's Date: 04/02/2023  PCP: Shelton Silvas PA REFERRING PROVIDER: Dr Rosita Kea  END OF SESSION:  OT End of Session - 04/02/23 1449     Visit Number 4    Number of Visits 10    Date for OT Re-Evaluation 04/30/23    OT Start Time 0817    OT Stop Time 0901    OT Time Calculation (min) 44 min    Activity Tolerance Patient tolerated treatment well    Behavior During Therapy [facility] for tasks assessed/performed             Past Medical History:  Diagnosis Date   Arthritis    knees, elbows, shoulders, ankles, wrists   DUB (dysfunctional uterine bleeding) 6.22.12   Endometrial polyp 6.22.12   Headache    Hypertension    IUD 11/24/10   MIRENA INSERTED 11/24/10   Vitamin D deficiency    Past Surgical History:  Procedure Laterality Date   COLONOSCOPY WITH PROPOFOL N/A 03/27/2022   Procedure: COLONOSCOPY WITH PROPOFOL;  Surgeon: Regis Bill, MD;  Location: [facility] ENDOSCOPY;  Service: Endoscopy;  Laterality: N/A;   HYSTEROSCOPY  6.22.12   polyp and dub   INTRAUTERINE DEVICE INSERTION  02/05/2019   MIRENA   ROBOTIC ASSISTED LAPAROSCOPIC HYSTERECTOMY AND SALPINGECTOMY Bilateral 04/21/2020   Procedure: XI ROBOTIC ASSISTED LAPAROSCOPIC TOTAL HYSTERECTOMY AND SALPINGECTOMY;  Surgeon: Theresia Majors, MD;  Location: [facility] OR;  Service: Gynecology;  Laterality: Bilateral;   Patient Active Problem List   Diagnosis Date Noted   S/P hysterectomy 04/21/2020   Essential hypertension 02/19/2020   Morbid (severe) obesity due to excess calories (HCC) 02/19/2020   Osteoarthrosis, unspecified whether generalized or localized, lower leg 02/19/2020   Hypertensive urgency 01/21/2020   Localized osteoarthritis of left knee 12/17/2018   Left elbow tendonitis 11/06/2018   Cervical spondylosis with radiculopathy 09/25/2018   Abnormal x-ray of cervical spine 06/27/2018    Radicular pain in left arm 06/13/2018   Knee pain 08/07/2016   ANA positive 03/08/2016   Pain in unspecified joint 03/08/2016   Vitamin D deficiency 03/08/2016   Recurrent headache 02/24/2015   Weight gain 12/06/2011    ONSET DATE: 01/24/23  REFERRING DIAG: Bilateral CTR  THERAPY DIAG:  Scar condition and fibrosis of skin  Stiffness of left wrist, not elsewhere classified  Muscle weakness (generalized)  Stiffness of left hand, not elsewhere classified  Rationale for Evaluation and Treatment: Rehabilitation  SUBJECTIVE:   SUBJECTIVE STATEMENT: No numbness but over the weekend this pinky side of my hand really bother me-went up to a 4/10 when I was gripping and bending my wrist.  Like squeezing a washcloth , braiding my hair.   Pt accompanied by: self  PERTINENT HISTORY: 03/08/23 Ortho note: The patient presents for clinical exam and determination of her work status post left carpal tunnel release on 01/24/2023. Her sutures were removed on 02/07/2023. She continues to experience pain in her palm and fingertips, although her numbness has improved. She reports difficulty using her left hand, particularly with gripping. Additionally, she has begun to experience radiating sensation in the middle of her hand. Activities such as squeezing a towel after showering and holding the steering wheel while driving exacerbate her symptoms.  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 She notes that her job does not offer light duty.  Refer to OT  PRECAUTIONS: None     WEIGHT BEARING RESTRICTIONS: No  PAIN:  Are you having pain? 4/10 ulnar side of scar - guyons canal or FCU  FALLS: Has patient fallen in last 6 months? 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  HAND DOMINANCE: Right  UPPER EXTREMITY ROM:     Active ROM Right eval Left eval R/L  03/26/23 L 03/28/33  Shoulder flexion      Shoulder abduction      Shoulder adduction      Shoulder extension      Shoulder internal rotation      Shoulder external rotation      Elbow flexion      Elbow extension      Wrist flexion 90 90  90  Wrist extension 55 60 pull 64/75 80  Wrist ulnar deviation      Wrist radial deviation      Wrist pronation      Wrist supination      (Blank rows = not tested)  Active ROM Right eval Left eval L 03/26/23  Thumb MCP (0-60)     Thumb IP (0-80)     Thumb Radial abd/add (0-55)   60 50 light pull in palm   Thumb Palmar abd/add (0-45)  42 pain 6/10   70  Thumb Opposition to Small Finger  Pain opposition to 4th   Opposition to 5th and sliding down   Index MCP (0-90)  70  80  Index PIP (0-100)   100 100  Index DIP (0-70)       Long MCP (0-90)   75  85  Long PIP (0-100)   100  100  Long DIP (0-70)       Ring MCP (0-90)   80  90  Ring PIP (0-100)    95 100  Ring DIP (0-70)       Little MCP (0-90)    90 90  Little PIP (0-100)    95 100  Little DIP (0-70)       (Blank rows = not tested)     HAND FUNCTION: Grip strength: Right: 60 lbs; Left: 25 lbs, Lateral pinch: Right: 16 lbs, Left: 8 lbs, and 3 point pinch: Right: 12 lbs, Left: 4 lbs  pain in L hand   03/26/23:  Grip strength: Right: 64 lbs; Left: 32 lbs, Lateral pinch: Right: 18 lbs, Left: 15 lbs, and 3 point pinch: Right: 12 lbs, Left: 10 lbs  03/29/23 Grip R 64 lbs and L 40 lbs   SENSATION: Report numbness improved  EDEMA: over L CT incision base of palm   COGNITION: Overall cognitive status: Within functional limits for tasks assessed       TODAY'S TREATMENT:  DATE: 04/02/23 Great progress in digits , thumb and wrist AROM  Decrease pain - and increase functional use - Increased discomfort of the ulnar side of palm and wrist. Some tenderness over the Guyon's canal towards FCU  Fluidotherapy to L hand and wrist prior to soft tissue massage and range of motion  Patient to continue with webspace massage but hold off on metacarpal sprites at home   cont at home contrast on L and heat on R  Patient to hold off on pressure stretch or table slides.   Patient to keep her wrist neutral on the left with gripping and lifting and pushing.   Fitted with a soft neoprene splint to wear to avoid composite flexion.     Patient was able to carry and lift 10 to 15 pounds with the right.  No increase symptoms.   With the left patient was able to do 3 pounds with no increase symptoms but 4 pounds bother patient.    Cont Green medium putty for gripping on the right as well as lateral and 3-point pinch 2 but hold off on any prehension strength with putty on the left   Cont L hand  tendon glides, thumb palmar /radial abduction and opposition symptom-free 12 reps Med N glide 5 reps to each hand 3x day -  Reviewed with patient some joint protection for left ulnar wrist and hand discomfort. Left message for Dr. Rosita Kea  PATIENT EDUCATION: Education details: findings of eval and HEP  Person educated: Patient Education method: Explanation, Demonstration, Tactile cues, Verbal cues, and Handouts Education comprehension: verbalized understanding, returned demonstration, verbal cues required, and needs further education   GOALS: Goals reviewed with patient? Yes  LONG TERM GOALS: Target date: 6 wks  Patient be independent in the home program to decrease pain in left hand to less than 2/10 with use. Baseline: Pain increases in the left palm and hand 6/10 with functional use Goal status: INITIAL  2.  Bilateral  wrist extension improved to within normal limits for patient to push and pull heavy door as well as turn doorknob without increased symptoms Baseline: Increase symptoms to 6/10 with wrist extension as well as radial abduction of thumb.  Decreased wrist extension 55 to 60 degrees. Goal status: INITIAL  3.  Bilateral thumb active range of motion and strength increased within normal limits for patient to turn doorknob as well as squeeze washcloth cut with a knife Baseline: Increased pain with radial abduction.  Bilateral palmar abduction decreased to 42 degrees on the left with tenderness in webspace pain increased to 3-6/10 with use Goal status: INITIAL  4.  Grip and prehension strength increased on the left within functional limits for her age to be able to return back to work Baseline: Right grip 60 and left 25 pounds, lateral pinch on the right 16 left 8 pounds and 3-point pinch in the right 12 pounds and left 4 pounds with increased symptoms Goal status: INITIAL  ASSESSMENT:  CLINICAL IMPRESSION: Patient seen for occupational therapy evaluation for bilateral carpal tunnel surgery with the right in May 24 and left September 24.  Patient made great progress  in pain , digits , thumb and wrist AROM in 3 visits-last 2 visits patient with increased discomfort on the ulnar side of hand and wrist.   Report over the weekend squeezing a washcloth braiding her hair with increased discomfort and tenderness.  Patient was able with right hand to carry and lift 10 to 15 pounds with no increase  symptoms.  Left hand to carry and left 3 pounds but increased discomfort with 4 pounds.  Patient to hold off on any scar mobilization and manual therapy to ulnar side of hand as well as wrist extension or table slides.  Patient to pay attention with gripping and pulling and lifting to keep wrist neutral.?  Irritation of FCU or Guyons canal-left a message with Dr. Rosita Kea -patient follow-up is next Wednesday but she is  released to return to work on Monday.  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  Cont to be  limited in functional use of bilateral hands in ADLs and IADLs.  Patient can benefit from skilled OT services to decrease scar tissue and pain and edema increase motion and strength to return to prior level of function as well as return back to work.  PERFORMANCE DEFICITS: in functional skills including ADLs, IADLs, ROM, strength, pain, flexibility, decreased knowledge of use of DME, and UE functional use,   and psychosocial skills including environmental adaptation and routines and behaviors.   IMPAIRMENTS: are limiting patient from ADLs, IADLs, rest and sleep, play, leisure, and social participation.   COMORBIDITIES: has no other co-morbidities that affects occupational performance. Patient will benefit from skilled OT to address above impairments and improve overall function.  MODIFICATION OR ASSISTANCE TO COMPLETE EVALUATION: No modification of tasks or assist necessary to complete an evaluation.  OT OCCUPATIONAL PROFILE AND HISTORY: Problem focused assessment: Including review of records relating to presenting problem.  CLINICAL DECISION MAKING: LOW - limited treatment options, no task modification necessary  REHAB POTENTIAL: Good for goals  EVALUATION COMPLEXITY: Low   PLAN:  OT FREQUENCY: 2x week and decrease to  1x wk as progress   OT DURATION: 6 weeks PLANNING: Splinting;Paraffin;Fluidtherapy;Contrast Bath;DME and/or AE instruction;Manual Therapy;Passive range of motion;Scar mobilization;Therapeutic activities, Ultrasound, Iontophoresis, There ex, pt education     Oletta Cohn, OTR/L,CLT 04/02/2023, 2:51 PM

## 2023-04-05 ENCOUNTER — Ambulatory Visit: Payer: 59 | Admitting: Occupational Therapy

## 2023-04-05 DIAGNOSIS — L905 Scar conditions and fibrosis of skin: Secondary | ICD-10-CM | POA: Diagnosis not present

## 2023-04-05 DIAGNOSIS — M25632 Stiffness of left wrist, not elsewhere classified: Secondary | ICD-10-CM

## 2023-04-05 DIAGNOSIS — M25642 Stiffness of left hand, not elsewhere classified: Secondary | ICD-10-CM

## 2023-04-05 DIAGNOSIS — M6281 Muscle weakness (generalized): Secondary | ICD-10-CM

## 2023-04-05 NOTE — Therapy (Signed)
OUTPATIENT OCCUPATIONAL THERAPY ORTHO Treatment   Patient Name: Kristen Shepherd MRN: [id number] DOB:[birth year], 47 y.o., female Today's Date: 04/05/2023  PCP: Shelton Silvas PA REFERRING PROVIDER: Dr Rosita Kea  END OF SESSION:  OT End of Session - 04/05/23 0836     Visit Number 5    Number of Visits 10    Date for OT Re-Evaluation 04/30/23    OT Start Time 0820    OT Stop Time 0857    OT Time Calculation (min) 37 min    Activity Tolerance Patient tolerated treatment well    Behavior During Therapy [facility] for tasks assessed/performed             Past Medical History:  Diagnosis Date   Arthritis    knees, elbows, shoulders, ankles, wrists   DUB (dysfunctional uterine bleeding) 6.22.12   Endometrial polyp 6.22.12   Headache    Hypertension    IUD 11/24/10   MIRENA INSERTED 11/24/10   Vitamin D deficiency    Past Surgical History:  Procedure Laterality Date   COLONOSCOPY WITH PROPOFOL N/A 03/27/2022   Procedure: COLONOSCOPY WITH PROPOFOL;  Surgeon: Regis Bill, MD;  Location: [facility] ENDOSCOPY;  Service: Endoscopy;  Laterality: N/A;   HYSTEROSCOPY  6.22.12   polyp and dub   INTRAUTERINE DEVICE INSERTION  02/05/2019   MIRENA   ROBOTIC ASSISTED LAPAROSCOPIC HYSTERECTOMY AND SALPINGECTOMY Bilateral 04/21/2020   Procedure: XI ROBOTIC ASSISTED LAPAROSCOPIC TOTAL HYSTERECTOMY AND SALPINGECTOMY;  Surgeon: Theresia Majors, MD;  Location: [facility] OR;  Service: Gynecology;  Laterality: Bilateral;   Patient Active Problem List   Diagnosis Date Noted   S/P hysterectomy 04/21/2020   Essential hypertension 02/19/2020   Morbid (severe) obesity due to excess calories (HCC) 02/19/2020   Osteoarthrosis, unspecified whether generalized or localized, lower leg 02/19/2020   Hypertensive urgency 01/21/2020   Localized osteoarthritis of left knee 12/17/2018   Left elbow tendonitis 11/06/2018   Cervical spondylosis with radiculopathy 09/25/2018   Abnormal x-ray of cervical spine 06/27/2018    Radicular pain in left arm 06/13/2018   Knee pain 08/07/2016   ANA positive 03/08/2016   Pain in unspecified joint 03/08/2016   Vitamin D deficiency 03/08/2016   Recurrent headache 02/24/2015   Weight gain 12/06/2011    ONSET DATE: 01/24/23  REFERRING DIAG: Bilateral CTR  THERAPY DIAG:  Scar condition and fibrosis of skin  Stiffness of left wrist, not elsewhere classified  Muscle weakness (generalized)  Stiffness of left hand, not elsewhere classified  Rationale for Evaluation and Treatment: Rehabilitation  SUBJECTIVE:   SUBJECTIVE STATEMENT: Was doing great - R hand about 95% - left one was doing great until this past weekend -  after gripping , squeezing, braiding my hair- this side of hand bothers me - better today than 2 days ago - but then I did not do any gripping , pinching like you told me to hold off the last 2-3 days  Pt accompanied by: self  PERTINENT HISTORY: 03/08/23 Ortho note: The patient presents for clinical exam and determination of her work status post left carpal tunnel release on 01/24/2023. Her sutures were removed on 02/07/2023. She continues to experience pain in her palm and fingertips, although her numbness has improved. She reports difficulty using her left hand, particularly with gripping. Additionally, she has begun to experience radiating sensation in the middle of her hand. Activities such as squeezing a towel after showering and holding the steering wheel while driving exacerbate her symptoms.  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 She notes that her job does not offer light duty.  Refer to OT   PRECAUTIONS: None     WEIGHT BEARING RESTRICTIONS: No  PAIN:  Are you having pain? 4/10 ulnar side of scar - guyons canal or FCU  FALLS: Has patient fallen in last 6 months? 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  HAND DOMINANCE: Right  UPPER EXTREMITY ROM:     Active ROM Right eval Left eval R/L  03/26/23 L 03/28/33  Shoulder flexion      Shoulder abduction      Shoulder adduction      Shoulder extension      Shoulder internal rotation      Shoulder external rotation      Elbow flexion      Elbow extension      Wrist flexion 90 90  90  Wrist extension 55 60 pull 64/75 80  Wrist ulnar deviation      Wrist radial deviation      Wrist pronation      Wrist supination      (Blank rows = not tested)  Active ROM Right eval Left eval L 03/26/23 L 04/05/23  Thumb MCP (0-60)      Thumb IP (0-80)      Thumb Radial abd/add (0-55)   60 50 light pull in palm  60  Thumb Palmar abd/add (0-45)  42 pain 6/10   70 70  Thumb Opposition to Small Finger  Pain opposition to 4th   Opposition to 5th and sliding down  Opposition to 5th -resistance pull over hypothenar  Index MCP (0-90)  70  80   Index PIP (0-100)   100 100   Index DIP (0-70)        Long MCP (0-90)   75  85   Long PIP (0-100)   100  100   Long DIP (0-70)        Ring MCP (0-90)   80  90   Ring PIP (0-100)    95 100   Ring DIP (0-70)        Little MCP (0-90)    90 90   Little PIP (0-100)    95 100   Little DIP (0-70)        (Blank rows = not tested)     HAND FUNCTION: Grip strength: Right: 60 lbs; Left: 25 lbs, Lateral pinch: Right: 16 lbs, Left: 8 lbs, and 3 point pinch: Right: 12 lbs, Left: 4 lbs  pain in L hand   03/26/23:  Grip strength: Right: 64 lbs; Left: 32 lbs, Lateral pinch: Right: 18 lbs, Left: 15 lbs, and 3 point pinch: Right: 12 lbs, Left: 10 lbs  03/29/23 Grip R 64 lbs and L 40 lbs  04/05/23 grip and prehension decrease because of discomfort over  hypothenar  SENSATION: Report numbness improved  EDEMA: over L CT incision base of palm   COGNITION: Overall cognitive status: Within functional limits for tasks assessed       TODAY'S TREATMENT:  DATE: 04/05/23 Decrease pain more discomfort after holding off on gripping, pinching and pulling/pushing with L hand  discomfort of the ulnar side of palm less than wrist today  Some tenderness over the Guyon's canal but neg tinel - NO numbness or pins and needles  Fluidotherapy with rotations of ice to L hand and wrist prior to soft tissue massage and range of motion  Patient to continue with webspace massage but hold off on metacarpal sprites at home   cont at home contrast on L and heat on R  Patient to hold off on pressure over palm , stretch or table slides.   Patient to keep her wrist neutral on the left with gripping and lifting and pushing.   Fitted with a soft neoprene splint to wear to avoid composite flexion.     Patient was able to carry and lift 10 to 15 pounds with the right.  No increase symptoms.    Cont Green medium putty for gripping on the right as well as lateral and 3-point pinch 2 but HOLD off on any prehension strength with putty on the left   Cont L hand  tendon glides, thumb palmar /radial abduction and opposition symptom-free 12 reps Med N glide 5 reps to each hand 3x day -  Reviewed with patient some joint protection for left ulnar wrist and hand discomfort. Appt later today with Dr Rosita Kea  PATIENT EDUCATION: Education details: findings of eval and HEP  Person educated: Patient Education method: Explanation, Demonstration, Tactile cues, Verbal cues, and Handouts Education comprehension: verbalized understanding, returned demonstration, verbal cues required, and needs further education   GOALS: Goals reviewed with patient?  Yes  LONG TERM GOALS: Target date: 6 wks  Patient be independent in the home program to decrease pain in left hand to less than 2/10 with use. Baseline: Pain increases in the left palm and hand 6/10 with functional use Goal status: INITIAL  2.  Bilateral wrist extension improved to within normal limits for patient to push and pull heavy door as well as turn doorknob without increased symptoms Baseline: Increase symptoms to 6/10 with wrist extension as well as radial abduction of thumb.  Decreased wrist extension 55 to 60 degrees. Goal status: INITIAL  3.  Bilateral thumb active range of motion and strength increased within normal limits for patient to turn doorknob as well as squeeze washcloth cut with a knife Baseline: Increased pain with radial abduction.  Bilateral palmar abduction decreased to 42 degrees on the left with tenderness in webspace pain increased to 3-6/10 with use Goal status: INITIAL  4.  Grip and prehension strength increased on the left within functional limits for her age to be able to return back to work Baseline: Right grip 60 and left 25 pounds, lateral pinch on the right 16 left 8 pounds and 3-point pinch in the right 12 pounds and left 4 pounds with increased symptoms Goal status: INITIAL  ASSESSMENT:  CLINICAL IMPRESSION: Patient seen for occupational therapy evaluation for bilateral carpal tunnel surgery with the right in May 24 and left September 24.  Patient was seen for 5th session today - made great progress  in pain , digits , thumb and wrist AROM but last 2 visit  increased discomfort on the Guyon canal - ulnar side of hand and wrist.   Less wrist today  Report over the weekend squeezing a washcloth braiding her hair with increased discomfort and tenderness.  Patient was able with right hand to carry and lift  10 to 15 pounds with no increase symptoms.  Left hand to carry and left 3 pounds but increased discomfort with 4 pounds.  Patient to hold off on any  scar mobilization and manual therapy to ulnar side of hand as well as wrist extension or table slides.  Patient to pay attention with gripping and pulling and lifting to keep wrist neutral.?  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  Cont to be  limited in functional use of bilateral hands in ADLs and IADLs.  Patient can benefit from skilled OT services to decrease scar tissue and pain and edema increase motion and strength to return to prior level of function as well as return back to work.  PERFORMANCE DEFICITS: in functional skills including ADLs, IADLs, ROM, strength, pain, flexibility, decreased knowledge of use of DME, and UE functional use,   and psychosocial skills including environmental adaptation and routines and behaviors.   IMPAIRMENTS: are limiting patient from ADLs, IADLs, rest and sleep, play, leisure, and social participation.   COMORBIDITIES: has no other co-morbidities that affects occupational performance. Patient will benefit from skilled OT to address above impairments and improve overall function.  MODIFICATION OR ASSISTANCE TO COMPLETE EVALUATION: No modification of tasks or assist necessary to complete an evaluation.  OT OCCUPATIONAL PROFILE AND HISTORY: Problem focused assessment: Including review of records relating to presenting problem.  CLINICAL DECISION MAKING: LOW - limited treatment options, no task modification necessary  REHAB POTENTIAL: Good for goals  EVALUATION COMPLEXITY: Low   PLAN:  OT FREQUENCY: 2x week and decrease to  1x wk as progress   OT DURATION: 6 weeks PLANNING: Splinting;Paraffin;Fluidtherapy;Contrast Bath;DME and/or AE instruction;Manual Therapy;Passive range of motion;Scar mobilization;Therapeutic activities, Ultrasound, Iontophoresis, There ex, pt education     Oletta Cohn, OTR/L,CLT 04/05/2023, 8:58  AM

## 2023-04-09 ENCOUNTER — Ambulatory Visit: Payer: 59 | Admitting: Occupational Therapy

## 2023-04-09 DIAGNOSIS — M25632 Stiffness of left wrist, not elsewhere classified: Secondary | ICD-10-CM

## 2023-04-09 DIAGNOSIS — M25642 Stiffness of left hand, not elsewhere classified: Secondary | ICD-10-CM

## 2023-04-09 DIAGNOSIS — L905 Scar conditions and fibrosis of skin: Secondary | ICD-10-CM | POA: Diagnosis not present

## 2023-04-09 DIAGNOSIS — M6281 Muscle weakness (generalized): Secondary | ICD-10-CM

## 2023-04-09 NOTE — Therapy (Signed)
OUTPATIENT OCCUPATIONAL THERAPY ORTHO Treatment   Patient Name: Kristen Shepherd MRN: [id number] DOB:March 02, 1976, 47 y.o., female Today's Date: 04/09/2023  PCP: Shelton Silvas PA REFERRING PROVIDER: Dr Rosita Kea  END OF SESSION:  OT End of Session - 04/09/23 0949     Visit Number 6    Number of Visits 10    Date for OT Re-Evaluation 04/30/23    OT Start Time 0949    OT Stop Time 1030    OT Time Calculation (min) 41 min    Activity Tolerance Patient tolerated treatment well    Behavior During Therapy [facility] for tasks assessed/performed             Past Medical History:  Diagnosis Date   Arthritis    knees, elbows, shoulders, ankles, wrists   DUB (dysfunctional uterine bleeding) 6.22.12   Endometrial polyp 6.22.12   Headache    Hypertension    IUD 11/24/10   MIRENA INSERTED 11/24/10   Vitamin D deficiency    Past Surgical History:  Procedure Laterality Date   COLONOSCOPY WITH PROPOFOL N/A 03/27/2022   Procedure: COLONOSCOPY WITH PROPOFOL;  Surgeon: Regis Bill, MD;  Location: [facility] ENDOSCOPY;  Service: Endoscopy;  Laterality: N/A;   HYSTEROSCOPY  6.22.12   polyp and dub   INTRAUTERINE DEVICE INSERTION  02/05/2019   MIRENA   ROBOTIC ASSISTED LAPAROSCOPIC HYSTERECTOMY AND SALPINGECTOMY Bilateral 04/21/2020   Procedure: XI ROBOTIC ASSISTED LAPAROSCOPIC TOTAL HYSTERECTOMY AND SALPINGECTOMY;  Surgeon: Theresia Majors, MD;  Location: [facility] OR;  Service: Gynecology;  Laterality: Bilateral;   Patient Active Problem List   Diagnosis Date Noted   S/P hysterectomy 04/21/2020   Essential hypertension 02/19/2020   Morbid (severe) obesity due to excess calories (HCC) 02/19/2020   Osteoarthrosis, unspecified whether generalized or localized, lower leg 02/19/2020   Hypertensive urgency 01/21/2020   Localized osteoarthritis of left knee 12/17/2018   Left elbow tendonitis 11/06/2018   Cervical spondylosis with radiculopathy 09/25/2018   Abnormal x-ray of cervical spine 06/27/2018    Radicular pain in left arm 06/13/2018   Knee pain 08/07/2016   ANA positive 03/08/2016   Pain in unspecified joint 03/08/2016   Vitamin D deficiency 03/08/2016   Recurrent headache 02/24/2015   Weight gain 12/06/2011    ONSET DATE: 01/24/23  REFERRING DIAG: Bilateral CTR  THERAPY DIAG:  Scar condition and fibrosis of skin  Muscle weakness (generalized)  Stiffness of left hand, not elsewhere classified  Stiffness of left wrist, not elsewhere classified  Rationale for Evaluation and Treatment: Rehabilitation  SUBJECTIVE:   SUBJECTIVE STATEMENT: Pain is better more little discomfort still but less than 2/10 - Dr Rosita Kea did give me steroid- still have 3 days left - did not use it a lot over weekend  Pt accompanied by: self  PERTINENT HISTORY: 03/08/23 Ortho note: The patient presents for clinical exam and determination of her work status post left carpal tunnel release on 01/24/2023. Her sutures were removed on 02/07/2023. She continues to experience pain in her palm and fingertips, although her numbness has improved. She reports difficulty using her left hand, particularly with gripping. Additionally, she has begun to experience radiating sensation in the middle of her hand. Activities such as squeezing a towel after showering and holding the steering wheel while driving exacerbate her symptoms.  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 She notes that her job does not offer light duty.  Refer to OT   PRECAUTIONS: None  WEIGHT BEARING RESTRICTIONS: No  PAIN:  Are you having pain? 1-2/10 ulnar side of scar - guy[place] canal discomfort  FALLS: Has patient fallen in last 6 months? 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  HAND DOMINANCE: Right  UPPER EXTREMITY ROM:     Active ROM Right eval Left eval R/L  03/26/23 L 03/28/33 L 04/09/23  Shoulder flexion       Shoulder abduction       Shoulder adduction       Shoulder extension       Shoulder internal rotation       Shoulder external rotation       Elbow flexion       Elbow extension       Wrist flexion 90 90  90 85 ( loose fist 55 pull 1-2/10)  Wrist extension 55 60 pull 64/75 80 70  Wrist ulnar deviation       Wrist radial deviation       Wrist pronation       Wrist supination       (Blank rows = not tested)  Active ROM Right eval Left eval L 03/26/23 L 04/05/23  Thumb MCP (0-60)      Thumb IP (0-80)      Thumb Radial abd/add (0-55)   60 50 light pull in palm  60  Thumb Palmar abd/add (0-45)  42 pain 6/10   70 70  Thumb Opposition to Small Finger  Pain opposition to 4th   Opposition to 5th and sliding down  Opposition to 5th -resistance pull over hypothenar  Index MCP (0-90)  70  80   Index PIP (0-100)   100 100   Index DIP (0-70)        Long MCP (0-90)   75  85   Long PIP (0-100)   100  100   Long DIP (0-70)        Ring MCP (0-90)   80  90   Ring PIP (0-100)    95 100   Ring DIP (0-70)        Little MCP (0-90)    90 90   Little PIP (0-100)    95 100   Little DIP (0-70)        (Blank rows = not tested)     HAND FUNCTION: Grip strength: Right: 60 lbs; Left: 25 lbs, Lateral pinch: Right: 16 lbs, Left: 8 lbs, and 3 point pinch: Right: 12 lbs, Left: 4 lbs  pain in L hand   03/26/23:  Grip strength: Right: 64 lbs; Left: 32 lbs, Lateral pinch: Right: 18 lbs, Left: 15 lbs, and 3 point pinch: Right: 12 lbs, Left: 10 lbs  03/29/23 Grip R 64 lbs and L 40 lbs  04/05/23 grip and prehension decrease because of discomfort over hypothenar  SENSATION: Report numbness improved  EDEMA: over L CT incision base of  palm   COGNITION: Overall cognitive status: Within functional limits for tasks assessed       TODAY'S TREATMENT:  DATE: 04/09/23 Some discomfort  less than 1-2/10 over the [place] but neg tinel - NO numbness or pins and needles  Fluidotherapy with rotations of ice to L hand and wrist prior  to increase range of motion less discomfort Review pain free wrist AROM at home after contrast   Patient to continue with webspace massage but hold off on metacarpal sprites at home   Patient to hold off on pressure over palm , stretch or table slides.   Patient to keep her wrist neutral on the left with gripping and lifting and pushing.     Cont L hand  tendon glides, thumb palmar /radial abduction and opposition symptom-free 12 reps Med N glide 5 reps to each hand 3x day -  Reviewed with patient some joint protection for left ulnar wrist and hand discomfort.   PATIENT EDUCATION: Education details: findings of eval and HEP  Person educated: Patient Education method: Explanation, Demonstration, Tactile cues, Verbal cues, and Handouts Education comprehension: verbalized understanding, returned demonstration, verbal cues required, and needs further education   GOALS: Goals reviewed with patient? Yes  LONG TERM GOALS: Target date: 6 wks  Patient be independent in the home program to decrease pain in left hand to less than 2/10 with use. Baseline: Pain increases in the left palm and hand 6/10 with functional use Goal status: INITIAL  2.  Bilateral wrist extension improved to within normal limits for patient to push and pull heavy door as well as turn doorknob without increased symptoms Baseline: Increase symptoms to 6/10 with wrist extension as well as radial abduction of thumb.  Decreased wrist extension 55 to 60 degrees. Goal status: INITIAL  3.   Bilateral thumb active range of motion and strength increased within normal limits for patient to turn doorknob as well as squeeze washcloth cut with a knife Baseline: Increased pain with radial abduction.  Bilateral palmar abduction decreased to 42 degrees on the left with tenderness in webspace pain increased to 3-6/10 with use Goal status: INITIAL  4.  Grip and prehension strength increased on the left within functional limits for her age to be able to return back to work Baseline: Right grip 60 and left 25 pounds, lateral pinch on the right 16 left 8 pounds and 3-point pinch in the right 12 pounds and left 4 pounds with increased symptoms Goal status: INITIAL  ASSESSMENT:  CLINICAL IMPRESSION: Patient seen for occupational therapy evaluation for bilateral carpal tunnel surgery with the right in May 24 and left September 24.  Patient was seen for 6th session today - did see Dr Rosita Kea last week and was started on steroid for discomfort on the [place] - ulnar side of hand- Pt report 3 days left and discomfort improved - only with composite fist and wrist with composite fist. Less than 1-2/10 -pt to cont medication and contrast -with painfree AROM and use.   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  Cont to be  limited in functional use of bilateral hands in ADLs and IADLs.  Patient can benefit from skilled OT services to decrease scar tissue and pain and edema increase motion and strength to return to prior level of function as well as return back to work.  PERFORMANCE DEFICITS: in functional skills including ADLs, IADLs, ROM, strength, pain, flexibility, decreased knowledge of use of DME, and UE functional use,   and psychosocial skills including environmental adaptation and routines and  behaviors.   IMPAIRMENTS: are limiting patient from ADLs, IADLs, rest and sleep, play, leisure, and social participation.   COMORBIDITIES: has no other  co-morbidities that affects occupational performance. Patient will benefit from skilled OT to address above impairments and improve overall function.  MODIFICATION OR ASSISTANCE TO COMPLETE EVALUATION: No modification of tasks or assist necessary to complete an evaluation.  OT OCCUPATIONAL PROFILE AND HISTORY: Problem focused assessment: Including review of records relating to presenting problem.  CLINICAL DECISION MAKING: LOW - limited treatment options, no task modification necessary  REHAB POTENTIAL: Good for goals  EVALUATION COMPLEXITY: Low   PLAN:  OT FREQUENCY: 2x week and decrease to  1x wk as progress   OT DURATION: 6 weeks PLANNING: Splinting;Paraffin;Fluidtherapy;Contrast Bath;DME and/or AE instruction;Manual Therapy;Passive range of motion;Scar mobilization;Therapeutic activities, Ultrasound, Iontophoresis, There ex, pt education     Oletta Cohn, OTR/L,CLT 04/09/2023, 5:30 PM

## 2023-04-12 ENCOUNTER — Ambulatory Visit: Payer: 59 | Admitting: Occupational Therapy

## 2023-04-12 DIAGNOSIS — M6281 Muscle weakness (generalized): Secondary | ICD-10-CM

## 2023-04-12 DIAGNOSIS — M25642 Stiffness of left hand, not elsewhere classified: Secondary | ICD-10-CM

## 2023-04-12 DIAGNOSIS — L905 Scar conditions and fibrosis of skin: Secondary | ICD-10-CM

## 2023-04-12 DIAGNOSIS — M25632 Stiffness of left wrist, not elsewhere classified: Secondary | ICD-10-CM

## 2023-04-12 NOTE — Therapy (Signed)
OUTPATIENT OCCUPATIONAL THERAPY ORTHO Treatment   Patient Name: Kristen Shepherd MRN: [id number] DOB:Aug 30, 1975, 47 y.o., female Today's Date: 04/12/2023  PCP: Shelton Silvas PA REFERRING PROVIDER: Dr Rosita Kea  END OF SESSION:  OT End of Session - 04/12/23 0943     Visit Number 7    Number of Visits 10    Date for OT Re-Evaluation 04/30/23    OT Start Time 0943    OT Stop Time 1012    OT Time Calculation (min) 29 min    Activity Tolerance Patient tolerated treatment well    Behavior During Therapy [facility] for tasks assessed/performed             Past Medical History:  Diagnosis Date   Arthritis    knees, elbows, shoulders, ankles, wrists   DUB (dysfunctional uterine bleeding) 6.22.12   Endometrial polyp 6.22.12   Headache    Hypertension    IUD 11/24/10   MIRENA INSERTED 11/24/10   Vitamin D deficiency    Past Surgical History:  Procedure Laterality Date   COLONOSCOPY WITH PROPOFOL N/A 03/27/2022   Procedure: COLONOSCOPY WITH PROPOFOL;  Surgeon: Regis Bill, MD;  Location: [facility] ENDOSCOPY;  Service: Endoscopy;  Laterality: N/A;   HYSTEROSCOPY  6.22.12   polyp and dub   INTRAUTERINE DEVICE INSERTION  02/05/2019   MIRENA   ROBOTIC ASSISTED LAPAROSCOPIC HYSTERECTOMY AND SALPINGECTOMY Bilateral 04/21/2020   Procedure: XI ROBOTIC ASSISTED LAPAROSCOPIC TOTAL HYSTERECTOMY AND SALPINGECTOMY;  Surgeon: Theresia Majors, MD;  Location: [facility] OR;  Service: Gynecology;  Laterality: Bilateral;   Patient Active Problem List   Diagnosis Date Noted   S/P hysterectomy 04/21/2020   Essential hypertension 02/19/2020   Morbid (severe) obesity due to excess calories (HCC) 02/19/2020   Osteoarthrosis, unspecified whether generalized or localized, lower leg 02/19/2020   Hypertensive urgency 01/21/2020   Localized osteoarthritis of left knee 12/17/2018   Left elbow tendonitis 11/06/2018   Cervical spondylosis with radiculopathy 09/25/2018   Abnormal x-ray of cervical spine 06/27/2018    Radicular pain in left arm 06/13/2018   Knee pain 08/07/2016   ANA positive 03/08/2016   Pain in unspecified joint 03/08/2016   Vitamin D deficiency 03/08/2016   Recurrent headache 02/24/2015   Weight gain 12/06/2011    ONSET DATE: 01/24/23  REFERRING DIAG: Bilateral CTR  THERAPY DIAG:  Scar condition and fibrosis of skin  Muscle weakness (generalized)  Stiffness of left hand, not elsewhere classified  Stiffness of left wrist, not elsewhere classified  Rationale for Evaluation and Treatment: Rehabilitation  SUBJECTIVE:   SUBJECTIVE STATEMENT: Took my last steroid today -and discomfort less than 1/10 with bending the wrist with fist - and squeezing - still not really using my L hand a lot- R hand about 95 % back to normal Pt accompanied by: self  PERTINENT HISTORY: 03/08/23 Ortho note: The patient presents for clinical exam and determination of her work status post left carpal tunnel release on 01/24/2023. Her sutures were removed on 02/07/2023. She continues to experience pain in her palm and fingertips, although her numbness has improved. She reports difficulty using her left hand, particularly with gripping. Additionally, she has begun to experience radiating sensation in the middle of her hand. Activities such as squeezing a towel after showering and holding the steering wheel while driving exacerbate her symptoms.  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 She notes that her job does not offer light duty.  Refer to OT  PRECAUTIONS: None     WEIGHT BEARING RESTRICTIONS: No  PAIN:  Are you having pain? 1/10 ulnar side of scar - guy[place] canal discomfort  FALLS: Has patient fallen in last 6 months? 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  HAND DOMINANCE: Right  UPPER EXTREMITY ROM:     Active ROM Right eval Left eval R/L  03/26/23 L 03/28/33 L 04/09/23 L 04/12/23  Shoulder flexion        Shoulder abduction        Shoulder adduction        Shoulder extension        Shoulder internal rotation        Shoulder external rotation        Elbow flexion        Elbow extension        Wrist flexion 90 90  90 85 ( loose fist 55 pull 1-2/10) 90 loose fist 78 1/10 pull  Wrist extension 55 60 pull 64/75 80 70 70  Wrist ulnar deviation        Wrist radial deviation        Wrist pronation        Wrist supination        (Blank rows = not tested)  Active ROM Right eval Left eval L 03/26/23 L 04/05/23 L 04/12/23  Thumb MCP (0-60)       Thumb IP (0-80)       Thumb Radial abd/add (0-55)   60 50 light pull in palm  60 60  Thumb Palmar abd/add (0-45)  42 pain 6/10   70 70 70  Thumb Opposition to Small Finger  Pain opposition to 4th   Opposition to 5th and sliding down  Opposition to 5th -resistance pull over hypothenar Opposition WNL - no pull  at thumb but resistance to 5th weakness at 5th slight pull   Index MCP (0-90)  70  80    Index PIP (0-100)   100 100    Index DIP (0-70)         Long MCP (0-90)   75  85    Long PIP (0-100)   100  100    Long DIP (0-70)         Ring MCP (0-90)   80  90    Ring PIP (0-100)    95 100    Ring DIP (0-70)         Little MCP (0-90)    90 90    Little PIP (0-100)    95 100    Little DIP (0-70)         (Blank rows = not tested)     HAND FUNCTION: Grip strength: Right: 60 lbs; Left: 25 lbs, Lateral pinch: Right: 16 lbs, Left: 8 lbs, and 3 point pinch: Right: 12 lbs, Left: 4 lbs  pain in L hand   03/26/23:  Grip strength: Right: 64 lbs; Left: 32 lbs, Lateral pinch: Right: 18 lbs, Left: 15 lbs, and 3 point pinch: Right: 12 lbs, Left: 10  lbs  03/29/23 Grip R 64 lbs and L 40 lbs  04/05/23 grip and prehension  decrease because of discomfort over hypothenar  04/12/23: Grip strength: Right: 65 lbs; Left: 35 lbs, ( discomfort)  Lateral pinch: Right: 18 lbs, Left: 15 lbs, and 3 point pinch: Right: 14 lbs, Left: 11 lbs SENSATION: Report numbness improved  EDEMA: over L CT incision base of palm   COGNITION: Overall cognitive status: Within functional limits for tasks assessed       TODAY'S TREATMENT:                                                                                                                              DATE: 04/09/23 Some discomfort  less than 1/10 over the [place] but neg tinel - NO numbness or pins and needles Discomfort mostly with composite wrist flexion with loose fist - but improving  As well as resistance to 5th with opposition- slight pull  And gripping tight-  Less than 1/10 today   Fluidotherapy L hand and wrist - followed by AROM - no pull or discomfort afterwards -  increase range of motion  Review pain free wrist AROM at home after contrast  Opposition  And thumb PA and RA   Patient to continue with webspace massage but hold off on metacarpal sprites at home   Patient to hold off on pressure over palm , stretch or table slides.   Patient to keep her wrist neutral on the left with gripping and lifting and pushing.     Cont L hand  thumb palmar /radial abduction and opposition symptom-free 12 reps Med N glide 5 reps to each hand 3x day -  Reviewed with patient some joint protection for left ulnar wrist and hand discomfort.   PATIENT EDUCATION: Education details: findings of eval and HEP  Person educated: Patient Education method: Explanation, Demonstration, Tactile cues, Verbal cues, and Handouts Education comprehension: verbalized understanding, returned demonstration, verbal cues required, and needs further education   GOALS: Goals reviewed with patient? Yes  LONG  TERM GOALS: Target date: 6 wks  Patient be independent in the home program to decrease pain in left hand to less than 2/10 with use. Baseline: Pain increases in the left palm and hand 6/10 with functional use Goal status: INITIAL  2.  Bilateral wrist extension improved to within normal limits for patient to push and pull heavy door as well as turn doorknob without increased symptoms Baseline: Increase symptoms to 6/10 with wrist extension as well as radial abduction of thumb.  Decreased wrist extension 55 to 60 degrees. Goal status: INITIAL  3.  Bilateral thumb active range of motion and strength increased within normal limits for patient to turn doorknob as well as squeeze washcloth cut with a knife Baseline: Increased pain with radial abduction.  Bilateral palmar abduction decreased to 42 degrees on the left with tenderness in webspace pain increased to 3-6/10 with use Goal status: INITIAL  4.  Grip and prehension strength increased on the left within functional limits for her age  to be able to return back to work Baseline: Right grip 60 and left 25 pounds, lateral pinch on the right 16 left 8 pounds and 3-point pinch in the right 12 pounds and left 4 pounds with increased symptoms Goal status: INITIAL  ASSESSMENT:  CLINICAL IMPRESSION: Patient seen for occupational therapy evaluation for bilateral carpal tunnel surgery with the right in May 24 and left September 24.  Patient did see Dr Rosita Kea last week and was started on steroid for discomfort on the [place] - ulnar side of hand- Pt took today last pill- less pain  and discomfort - only with tight composite fist and wrist flexion with composite fist. Less than 1/10 with increase flexion  -pt cont  with contrast -with painfree AROM and use. R hand and wrist about 95% - increase grip and prehension.    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  Cont to be  limited in functional use of  bilateral hands in ADLs and IADLs.  Patient can benefit from skilled OT services to decrease scar tissue and pain and edema increase motion and strength to return to prior level of function as well as return back to work.  PERFORMANCE DEFICITS: in functional skills including ADLs, IADLs, ROM, strength, pain, flexibility, decreased knowledge of use of DME, and UE functional use,   and psychosocial skills including environmental adaptation and routines and behaviors.   IMPAIRMENTS: are limiting patient from ADLs, IADLs, rest and sleep, play, leisure, and social participation.   COMORBIDITIES: has no other co-morbidities that affects occupational performance. Patient will benefit from skilled OT to address above impairments and improve overall function.  MODIFICATION OR ASSISTANCE TO COMPLETE EVALUATION: No modification of tasks or assist necessary to complete an evaluation.  OT OCCUPATIONAL PROFILE AND HISTORY: Problem focused assessment: Including review of records relating to presenting problem.  CLINICAL DECISION MAKING: LOW - limited treatment options, no task modification necessary  REHAB POTENTIAL: Good for goals  EVALUATION COMPLEXITY: Low   PLAN:  OT FREQUENCY: 2x week and decrease to  1x wk as progress   OT DURATION: 6 weeks PLANNING: Splinting;Paraffin;Fluidtherapy;Contrast Bath;DME and/or AE instruction;Manual Therapy;Passive range of motion;Scar mobilization;Therapeutic activities, Ultrasound, Iontophoresis, There ex, pt education     Oletta Cohn, OTR/L,CLT 04/12/2023, 10:13 AM

## 2023-04-16 ENCOUNTER — Ambulatory Visit: Payer: 59 | Admitting: Occupational Therapy

## 2023-04-16 DIAGNOSIS — M25632 Stiffness of left wrist, not elsewhere classified: Secondary | ICD-10-CM

## 2023-04-16 DIAGNOSIS — M25642 Stiffness of left hand, not elsewhere classified: Secondary | ICD-10-CM

## 2023-04-16 DIAGNOSIS — L905 Scar conditions and fibrosis of skin: Secondary | ICD-10-CM

## 2023-04-16 DIAGNOSIS — M6281 Muscle weakness (generalized): Secondary | ICD-10-CM

## 2023-04-16 NOTE — Therapy (Signed)
OUTPATIENT OCCUPATIONAL THERAPY ORTHO Treatment   Patient Name: Kristen Shepherd MRN: [id number] DOB:[birth year], 47 y.o., female Today's Date: 04/16/2023  PCP: Shelton Silvas PA REFERRING PROVIDER: Dr Rosita Kea  END OF SESSION:  OT End of Session - 04/16/23 1051     Visit Number 8    Number of Visits 10    Date for OT Re-Evaluation 04/30/23    OT Start Time 1035    OT Stop Time 1115    OT Time Calculation (min) 40 min    Activity Tolerance Patient tolerated treatment well    Behavior During Therapy [facility] for tasks assessed/performed             Past Medical History:  Diagnosis Date   Arthritis    knees, elbows, shoulders, ankles, wrists   DUB (dysfunctional uterine bleeding) 6.22.12   Endometrial polyp 6.22.12   Headache    Hypertension    IUD 11/24/10   MIRENA INSERTED 11/24/10   Vitamin D deficiency    Past Surgical History:  Procedure Laterality Date   COLONOSCOPY WITH PROPOFOL N/A 03/27/2022   Procedure: COLONOSCOPY WITH PROPOFOL;  Surgeon: Regis Bill, MD;  Location: [facility] ENDOSCOPY;  Service: Endoscopy;  Laterality: N/A;   HYSTEROSCOPY  6.22.12   polyp and dub   INTRAUTERINE DEVICE INSERTION  02/05/2019   MIRENA   ROBOTIC ASSISTED LAPAROSCOPIC HYSTERECTOMY AND SALPINGECTOMY Bilateral 04/21/2020   Procedure: XI ROBOTIC ASSISTED LAPAROSCOPIC TOTAL HYSTERECTOMY AND SALPINGECTOMY;  Surgeon: Theresia Majors, MD;  Location: [facility] OR;  Service: Gynecology;  Laterality: Bilateral;   Patient Active Problem List   Diagnosis Date Noted   S/P hysterectomy 04/21/2020   Essential hypertension 02/19/2020   Morbid (severe) obesity due to excess calories (HCC) 02/19/2020   Osteoarthrosis, unspecified whether generalized or localized, lower leg 02/19/2020   Hypertensive urgency 01/21/2020   Localized osteoarthritis of left knee 12/17/2018   Left elbow tendonitis 11/06/2018   Cervical spondylosis with radiculopathy 09/25/2018   Abnormal x-ray of cervical spine 06/27/2018    Radicular pain in left arm 06/13/2018   Knee pain 08/07/2016   ANA positive 03/08/2016   Pain in unspecified joint 03/08/2016   Vitamin D deficiency 03/08/2016   Recurrent headache 02/24/2015   Weight gain 12/06/2011    ONSET DATE: 01/24/23  REFERRING DIAG: Bilateral CTR  THERAPY DIAG:  Scar condition and fibrosis of skin  Stiffness of left hand, not elsewhere classified  Stiffness of left wrist, not elsewhere classified  Muscle weakness (generalized)  Rationale for Evaluation and Treatment: Rehabilitation  SUBJECTIVE:   SUBJECTIVE STATEMENT: Took my last steroid last Thursday -and discomfort  still about  1-2/10 with bending the wrist with fist - and squeezing - still not really using my L hand a lot- R hand great Pt accompanied by: self  PERTINENT HISTORY: 03/08/23 Ortho note: The patient presents for clinical exam and determination of her work status post left carpal tunnel release on 01/24/2023. Her sutures were removed on 02/07/2023. She continues to experience pain in her palm and fingertips, although her numbness has improved. She reports difficulty using her left hand, particularly with gripping. Additionally, she has begun to experience radiating sensation in the middle of her hand. Activities such as squeezing a towel after showering and holding the steering wheel while driving exacerbate her symptoms.  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 She notes that her job does not offer light duty.  Refer to OT   PRECAUTIONS: None  WEIGHT BEARING RESTRICTIONS: No  PAIN:  Are you having pain? 1-2/10 ulnar side of scar - guyons canal discomfort  FALLS: Has patient fallen in last 6 months? 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  HAND DOMINANCE: Right  UPPER EXTREMITY ROM:     Active ROM Right eval Left eval R/L  03/26/23 L 03/28/33 L 04/09/23 L 04/12/23  Shoulder flexion        Shoulder abduction        Shoulder adduction        Shoulder extension        Shoulder internal rotation        Shoulder external rotation        Elbow flexion        Elbow extension        Wrist flexion 90 90  90 85 ( loose fist 55 pull 1-2/10) 90 loose fist 78 1/10 pull  Wrist extension 55 60 pull 64/75 80 70 70  Wrist ulnar deviation        Wrist radial deviation        Wrist pronation        Wrist supination        (Blank rows = not tested)  Active ROM Right eval Left eval L 03/26/23 L 04/05/23 L 04/12/23  Thumb MCP (0-60)       Thumb IP (0-80)       Thumb Radial abd/add (0-55)   60 50 light pull in palm  60 60  Thumb Palmar abd/add (0-45)  42 pain 6/10   70 70 70  Thumb Opposition to Small Finger  Pain opposition to 4th   Opposition to 5th and sliding down  Opposition to 5th -resistance pull over hypothenar Opposition WNL - no pull  at thumb but resistance to 5th weakness at 5th slight pull   Index MCP (0-90)  70  80    Index PIP (0-100)   100 100    Index DIP (0-70)         Long MCP (0-90)   75  85    Long PIP (0-100)   100  100    Long DIP (0-70)         Ring MCP (0-90)   80  90    Ring PIP (0-100)    95 100    Ring DIP (0-70)         Little MCP (0-90)    90 90    Little PIP (0-100)    95 100    Little DIP (0-70)         (Blank rows = not tested)     HAND FUNCTION: Grip strength: Right: 60 lbs; Left: 25 lbs, Lateral pinch: Right: 16 lbs, Left: 8 lbs, and 3 point pinch: Right: 12 lbs, Left: 4 lbs  pain in L hand   03/26/23:  Grip strength: Right: 64 lbs; Left: 32 lbs, Lateral pinch: Right: 18 lbs, Left: 15 lbs, and 3 point pinch: Right: 12 lbs, Left: 10  lbs  03/29/23 Grip R 64 lbs and L 40 lbs  04/05/23 grip and prehension decrease because of discomfort over hypothenar  04/12/23: Grip strength: Right: 65 lbs; Left: 35 lbs, ( discomfort)  Lateral pinch: Right: 18 lbs, Left: 15 lbs, and 3 point pinch: Right: 14 lbs, Left: 11 lbs 04/12/23: Grip strength: Right: 65 lbs; Left: 45 lbs, ( discomfort)  Lateral pinch: Right: 18 lbs, Left: 16 lbs, and 3 point pinch: Right: 14 lbs, Left: 12 lbs  SENSATION: Report numbness improved  EDEMA: over L CT incision base of palm   COGNITION: Overall cognitive status: Within functional limits for tasks assessed       TODAY'S TREATMENT:                                                                                                                              DATE: 04/16/23 Some discomfort cont even after prednisone - 1-2/10 ulnar side of scar  Discomfort mostly with composite wrist flexion with loose fist - decrease wrist flexion compare to last time As well as resistance to 5th with opposition- slight pull  And gripping tight-    Fluidotherapy L hand and wrist - followed by AROM - no pull or discomfort afterwards -  increase range of motion  Review pain free wrist AROM at home after contrast  Opposition  And thumb PA and RA   Done some scar massage today -  focus on using cica scar pad in combination with mini massager- used graston tool nr 2 for brushing - distal scar and manual by OT over scar  CT spreads Pt to start back scar massage and soft tissue mobs   Cont L hand  thumb palmar /radial abduction and opposition symptom-free 12 reps Med N glide 5 reps to each hand 3x day -  Reviewed with patient some joint protection for left ulnar wrist and hand discomfort.   PATIENT EDUCATION: Education details: findings of eval and HEP  Person educated: Patient Education method: Explanation, Demonstration, Tactile cues, Verbal cues, and Handouts Education comprehension: verbalized understanding,  returned demonstration, verbal cues required, and needs further education   GOALS: Goals reviewed with patient? Yes  LONG TERM GOALS: Target date: 6 wks  Patient be independent in the home program to decrease pain in left hand to less than 2/10 with use. Baseline: Pain increases in the left palm and hand 6/10 with functional use Goal status: INITIAL  2.  Bilateral wrist extension improved to within normal limits for patient to push and pull heavy door as well as turn doorknob without increased symptoms Baseline: Increase symptoms to 6/10 with wrist extension as well as radial abduction of thumb.  Decreased wrist extension 55 to 60 degrees. Goal status: INITIAL  3.  Bilateral thumb active range of motion and strength increased within normal limits for patient to turn doorknob as well as squeeze washcloth cut with a knife Baseline: Increased pain with radial abduction.  Bilateral palmar abduction decreased to 42 degrees on the left with tenderness in webspace pain increased to 3-6/10 with use Goal status: INITIAL  4.  Grip and prehension strength increased on the left within functional limits for her age to be able to return back to work Baseline: Right grip 60 and left 25 pounds, lateral pinch on the right 16 left 8 pounds and 3-point pinch in the right 12 pounds and left 4 pounds with increased symptoms Goal status: INITIAL  ASSESSMENT:  CLINICAL IMPRESSION: Patient seen for occupational therapy evaluation for bilateral carpal tunnel surgery with the right in May 24 and left September 24.  Patient finished steroid for discomfort  last week - discomfort over [place] canal  and ulnar side of scar in L hand- Pt cont to feel discomfort  1-2/10- only with tight composite fist and wrist flexion and palpation- initiated scar mobs again today -discomfort on 1cm by 1cm area ulnar to volar scar.   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  Cont to be   limited in functional use of bilateral hands in ADLs and IADLs.  Patient can benefit from skilled OT services to decrease scar tissue and pain and edema increase motion and strength to return to prior level of function as well as return back to work.  PERFORMANCE DEFICITS: in functional skills including ADLs, IADLs, ROM, strength, pain, flexibility, decreased knowledge of use of DME, and UE functional use,   and psychosocial skills including environmental adaptation and routines and behaviors.   IMPAIRMENTS: are limiting patient from ADLs, IADLs, rest and sleep, play, leisure, and social participation.   COMORBIDITIES: has no other co-morbidities that affects occupational performance. Patient will benefit from skilled OT to address above impairments and improve overall function.  MODIFICATION OR ASSISTANCE TO COMPLETE EVALUATION: No modification of tasks or assist necessary to complete an evaluation.  OT OCCUPATIONAL PROFILE AND HISTORY: Problem focused assessment: Including review of records relating to presenting problem.  CLINICAL DECISION MAKING: LOW - limited treatment options, no task modification necessary  REHAB POTENTIAL: Good for goals  EVALUATION COMPLEXITY: Low   PLAN:  OT FREQUENCY: 2x week and decrease to  1x wk as progress   OT DURATION: 6 weeks PLANNING: Splinting;Paraffin;Fluidtherapy;Contrast Bath;DME and/or AE instruction;Manual Therapy;Passive range of motion;Scar mobilization;Therapeutic activities, Ultrasound, Iontophoresis, There ex, pt education     Oletta Cohn, OTR/L,CLT 04/16/2023, 12:54 PM

## 2023-04-26 ENCOUNTER — Ambulatory Visit: Payer: 59 | Attending: Orthopedic Surgery | Admitting: Occupational Therapy

## 2023-04-26 DIAGNOSIS — M25642 Stiffness of left hand, not elsewhere classified: Secondary | ICD-10-CM | POA: Diagnosis present

## 2023-04-26 DIAGNOSIS — M6281 Muscle weakness (generalized): Secondary | ICD-10-CM | POA: Insufficient documentation

## 2023-04-26 DIAGNOSIS — L905 Scar conditions and fibrosis of skin: Secondary | ICD-10-CM | POA: Diagnosis present

## 2023-04-26 DIAGNOSIS — M25632 Stiffness of left wrist, not elsewhere classified: Secondary | ICD-10-CM | POA: Insufficient documentation

## 2023-04-26 NOTE — Therapy (Signed)
OUTPATIENT OCCUPATIONAL THERAPY ORTHO Treatment   Patient Name: Kristen Shepherd MRN: [id number] DOB:04/15/1976, 47 y.o., female Today's Date: 04/26/2023  PCP: Shelton Silvas PA REFERRING PROVIDER: Dr Rosita Kea  END OF SESSION:  OT End of Session - 04/26/23 1637     Visit Number 9    Number of Visits 10    Date for OT Re-Evaluation 04/30/23    OT Start Time 0945    OT Stop Time 1032    OT Time Calculation (min) 47 min    Activity Tolerance Patient tolerated treatment well    Behavior During Therapy [facility] for tasks assessed/performed             Past Medical History:  Diagnosis Date   Arthritis    knees, elbows, shoulders, ankles, wrists   DUB (dysfunctional uterine bleeding) 6.22.12   Endometrial polyp 6.22.12   Headache    Hypertension    IUD 11/24/10   MIRENA INSERTED 11/24/10   Vitamin D deficiency    Past Surgical History:  Procedure Laterality Date   COLONOSCOPY WITH PROPOFOL N/A 03/27/2022   Procedure: COLONOSCOPY WITH PROPOFOL;  Surgeon: Regis Bill, MD;  Location: [facility] ENDOSCOPY;  Service: Endoscopy;  Laterality: N/A;   HYSTEROSCOPY  6.22.12   polyp and dub   INTRAUTERINE DEVICE INSERTION  02/05/2019   MIRENA   ROBOTIC ASSISTED LAPAROSCOPIC HYSTERECTOMY AND SALPINGECTOMY Bilateral 04/21/2020   Procedure: XI ROBOTIC ASSISTED LAPAROSCOPIC TOTAL HYSTERECTOMY AND SALPINGECTOMY;  Surgeon: Theresia Majors, MD;  Location: [facility] OR;  Service: Gynecology;  Laterality: Bilateral;   Patient Active Problem List   Diagnosis Date Noted   S/P hysterectomy 04/21/2020   Essential hypertension 02/19/2020   Morbid (severe) obesity due to excess calories (HCC) 02/19/2020   Osteoarthrosis, unspecified whether generalized or localized, lower leg 02/19/2020   Hypertensive urgency 01/21/2020   Localized osteoarthritis of left knee 12/17/2018   Left elbow tendonitis 11/06/2018   Cervical spondylosis with radiculopathy 09/25/2018   Abnormal x-ray of cervical spine 06/27/2018    Radicular pain in left arm 06/13/2018   Knee pain 08/07/2016   ANA positive 03/08/2016   Pain in unspecified joint 03/08/2016   Vitamin D deficiency 03/08/2016   Recurrent headache 02/24/2015   Weight gain 12/06/2011    ONSET DATE: 01/24/23  REFERRING DIAG: Bilateral CTR  THERAPY DIAG:  Scar condition and fibrosis of skin  Stiffness of left hand, not elsewhere classified  Stiffness of left wrist, not elsewhere classified  Muscle weakness (generalized)  Rationale for Evaluation and Treatment: Rehabilitation  SUBJECTIVE:   SUBJECTIVE STATEMENT: It has been out 2 weeks that.  The steroid.  I still feel that pain that same area now 1-3/10 pain.  Discomfort.  Denies stop.  Is just random simple activities.   Pt accompanied by: self  PERTINENT HISTORY: 03/08/23 Ortho note: The patient presents for clinical exam and determination of her work status post left carpal tunnel release on 01/24/2023. Her sutures were removed on 02/07/2023. She continues to experience pain in her palm and fingertips, although her numbness has improved. She reports difficulty using her left hand, particularly with gripping. Additionally, she has begun to experience radiating sensation in the middle of her hand. Activities such as squeezing a towel after showering and holding the steering wheel while driving exacerbate her symptoms.  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 She notes that her job does not offer light duty.  Refer to OT   PRECAUTIONS: None  WEIGHT BEARING RESTRICTIONS: No  PAIN:  Are you having pain? 1-2/10 ulnar side of scar - guyons canal discomfort  FALLS: Has patient fallen in last 6 months? 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  HAND DOMINANCE: Right  UPPER EXTREMITY ROM:     Active ROM Right eval Left eval R/L  03/26/23 L 03/28/33 L 04/09/23 L 04/12/23  Shoulder flexion        Shoulder abduction        Shoulder adduction        Shoulder extension        Shoulder internal rotation        Shoulder external rotation        Elbow flexion        Elbow extension        Wrist flexion 90 90  90 85 ( loose fist 55 pull 1-2/10) 90 loose fist 78 1/10 pull  Wrist extension 55 60 pull 64/75 80 70 70  Wrist ulnar deviation        Wrist radial deviation        Wrist pronation        Wrist supination        (Blank rows = not tested)  Active ROM Right eval Left eval L 03/26/23 L 04/05/23 L 04/12/23  Thumb MCP (0-60)       Thumb IP (0-80)       Thumb Radial abd/add (0-55)   60 50 light pull in palm  60 60  Thumb Palmar abd/add (0-45)  42 pain 6/10   70 70 70  Thumb Opposition to Small Finger  Pain opposition to 4th   Opposition to 5th and sliding down  Opposition to 5th -resistance pull over hypothenar Opposition WNL - no pull  at thumb but resistance to 5th weakness at 5th slight pull   Index MCP (0-90)  70  80    Index PIP (0-100)   100 100    Index DIP (0-70)         Long MCP (0-90)   75  85    Long PIP (0-100)   100  100    Long DIP (0-70)         Ring MCP (0-90)   80  90    Ring PIP (0-100)    95 100    Ring DIP (0-70)         Little MCP (0-90)    90 90    Little PIP (0-100)    95 100    Little DIP (0-70)         (Blank rows = not tested)     HAND FUNCTION: Grip strength: Right: 60 lbs; Left: 25 lbs, Lateral pinch: Right: 16 lbs, Left: 8 lbs, and 3 point pinch: Right: 12 lbs, Left: 4 lbs  pain in L hand   03/26/23:  Grip strength: Right: 64 lbs; Left: 32 lbs, Lateral pinch: Right: 18 lbs, Left: 15 lbs, and 3 point pinch: Right: 12 lbs, Left: 10 lbs  03/29/23  Grip R 64 lbs and L 40 lbs  04/05/23 grip and prehension decrease because of discomfort over hypothenar  04/12/23: Grip strength: Right: 65 lbs; Left: 35 lbs, ( discomfort)  Lateral pinch: Right: 18 lbs, Left: 15 lbs, and 3 point pinch: Right: 14 lbs, Left: 11 lbs 04/12/23: Grip strength: Right: 65 lbs; Left: 45 lbs, ( discomfort)  Lateral pinch: Right: 18 lbs, Left: 16 lbs, and 3 point pinch: Right: 14 lbs, Left: 12 lbs  SENSATION: Report numbness improved  EDEMA: over L CT incision base of palm   COGNITION: Overall cognitive status: Within functional limits for tasks assessed       TODAY'S TREATMENT:                                                                                                                              DATE: 04/26/23 Some discomfort and pain randomly what appeared to be mostly with composite fist with a composite fist with the wrist flexion- 1-3/10 ulnar side of scar -but can increase if not stopping activity.  Assess patient's simulating work activities.  Patient pick up boxes of about 75 pounds at work repetitively. Simulate different heights from floor to table to chair.  Started with 10 pounds went up to 40 pounds. Recommend for patient to support weight on right upper extremities and guide with left. Patient did not had increased pain was able to maintain wrist in neutral. Patient provided with some exercises for squatting  with 10 lbs weight as well as sit and stand from chair for conditioning and preparing her to return back to work picking up 75 pounds.  Patient had increased symptoms with a 4 pound weight on the left wrist with exercises. Was able to do 2 pound weight for supination and pronation as well as radial ulnar deviation.  As well as wrist flexion extension. Right side 5 pounds for wrist For elbow flexion as well as shoulder and scapular strengthening exercises patient can use 5 pounds on the left and 8 to 10 pounds in the right. 2 sets of 12  and can increase over the weekend to the second third set pain-free  Fluidotherapy L hand and wrist - followed by AROM -less of a pull and discomfort afterwards -  increase range of motion symptom-free Patient to continue scar massage  -focusing on the proximal part of scar  Patient to follow-up with surgeon on Monday.  PATIENT EDUCATION: Education details: findings of eval and HEP  Person educated: Patient Education method: Explanation, Demonstration, Tactile cues, Verbal cues, and Handouts Education comprehension: verbalized understanding, returned demonstration, verbal cues required, and needs further education   GOALS: Goals reviewed with patient? Yes  LONG TERM GOALS: Target date: 6 wks  Patient be independent in the home program to decrease pain in left hand to less than 2/10 with use. Baseline: Pain increases in the left palm and hand 6/10 with functional use Goal status: INITIAL  2.  Bilateral wrist extension improved to within normal limits for patient to push and pull heavy door as well as turn doorknob  without increased symptoms Baseline: Increase symptoms to 6/10 with wrist extension as well as radial abduction of thumb.  Decreased wrist extension 55 to 60 degrees. Goal status: INITIAL  3.  Bilateral thumb active range of motion and strength increased within normal limits for patient to turn doorknob as well as squeeze washcloth cut with a knife Baseline: Increased pain with radial abduction.  Bilateral palmar abduction decreased to 42 degrees on the left with tenderness in webspace pain increased to 3-6/10 with use Goal status: INITIAL  4.  Grip and prehension strength increased on the left within functional limits for her age to be able to return back to work Baseline: Right grip 60 and left 25 pounds, lateral pinch on the right 16 left 8 pounds and 3-point pinch in the right 12 pounds and left 4 pounds with increased symptoms Goal status:  INITIAL  ASSESSMENT:  CLINICAL IMPRESSION: Patient seen for occupational therapy evaluation for bilateral carpal tunnel surgery with the right in May 24 and left September 24.  Patient finished steroid about 2 weeks ago and reports increased discomfort again  over [place]  and ulnar side of scar in L hand- Pt cont to feel discomfort  1-3/10-with a random activities.  But appear to be mostly with tight composite fist and composite fist in combination with wrist flexion . Initiated scar mobs again today -did do some upper extremity conditioning with patient using for the left upper extremity 2 to 5 pounds on the right 5 to 10 pounds.  Also simulating some of her work activities picking up and moving boxes -done in the clinic up to 40 pounds.   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  Cont to be  limited in functional use of bilateral hands in ADLs and IADLs.  Patient can benefit from skilled OT services to decrease scar tissue and pain and edema increase motion and strength to return to prior level of function as well as return back to work.  PERFORMANCE DEFICITS: in functional skills including ADLs, IADLs, ROM, strength, pain, flexibility, decreased knowledge of use of DME, and UE functional use,   and psychosocial skills including environmental adaptation and routines and behaviors.   IMPAIRMENTS: are limiting patient from ADLs, IADLs, rest and sleep, play, leisure, and social participation.   COMORBIDITIES: has no other co-morbidities that affects occupational performance. Patient will benefit from skilled OT to address above impairments and improve overall function.  MODIFICATION OR ASSISTANCE TO COMPLETE EVALUATION: No modification of tasks or assist necessary to complete an evaluation.  OT OCCUPATIONAL PROFILE AND HISTORY: Problem focused assessment: Including review of records relating to presenting problem.  CLINICAL DECISION MAKING:  LOW - limited treatment options, no task modification necessary  REHAB POTENTIAL: Good for goals  EVALUATION COMPLEXITY: Low   PLAN:  OT FREQUENCY: 2x week and decrease to  1x wk as progress   OT DURATION: 6 weeks PLANNING: Splinting;Paraffin;Fluidtherapy;Contrast Bath;DME and/or AE instruction;Manual Therapy;Passive range of motion;Scar mobilization;Therapeutic activities, Ultrasound, Iontophoresis, There ex, pt education     Oletta Cohn, OTR/L,CLT 04/26/2023, 4:39 PM

## 2023-05-02 ENCOUNTER — Ambulatory Visit: Payer: 59 | Admitting: Occupational Therapy

## 2023-05-02 DIAGNOSIS — L905 Scar conditions and fibrosis of skin: Secondary | ICD-10-CM | POA: Diagnosis not present

## 2023-05-02 DIAGNOSIS — M25632 Stiffness of left wrist, not elsewhere classified: Secondary | ICD-10-CM

## 2023-05-02 DIAGNOSIS — M6281 Muscle weakness (generalized): Secondary | ICD-10-CM

## 2023-05-02 DIAGNOSIS — M25642 Stiffness of left hand, not elsewhere classified: Secondary | ICD-10-CM

## 2023-05-02 NOTE — Therapy (Signed)
OUTPATIENT OCCUPATIONAL THERAPY ORTHO Treatment /10th visit  Patient Name: Kristen Shepherd MRN: [id number] DOB:10/02/1975, 47 y.o., female Today's Date: 05/02/2023  PCP: Shelton Silvas PA REFERRING PROVIDER: Dr Rosita Kea  END OF SESSION:  OT End of Session - 05/02/23 1731     Visit Number 10    Number of Visits 18    Date for OT Re-Evaluation 05/31/23    OT Start Time 1601    OT Stop Time 1646    OT Time Calculation (min) 45 min    Activity Tolerance Patient tolerated treatment well    Behavior During Therapy [facility] for tasks assessed/performed             Past Medical History:  Diagnosis Date   Arthritis    knees, elbows, shoulders, ankles, wrists   DUB (dysfunctional uterine bleeding) 6.22.12   Endometrial polyp 6.22.12   Headache    Hypertension    IUD 11/24/10   MIRENA INSERTED 11/24/10   Vitamin D deficiency    Past Surgical History:  Procedure Laterality Date   COLONOSCOPY WITH PROPOFOL N/A 03/27/2022   Procedure: COLONOSCOPY WITH PROPOFOL;  Surgeon: Regis Bill, MD;  Location: [facility] ENDOSCOPY;  Service: Endoscopy;  Laterality: N/A;   HYSTEROSCOPY  6.22.12   polyp and dub   INTRAUTERINE DEVICE INSERTION  02/05/2019   MIRENA   ROBOTIC ASSISTED LAPAROSCOPIC HYSTERECTOMY AND SALPINGECTOMY Bilateral 04/21/2020   Procedure: XI ROBOTIC ASSISTED LAPAROSCOPIC TOTAL HYSTERECTOMY AND SALPINGECTOMY;  Surgeon: Theresia Majors, MD;  Location: [facility] OR;  Service: Gynecology;  Laterality: Bilateral;   Patient Active Problem List   Diagnosis Date Noted   S/P hysterectomy 04/21/2020   Essential hypertension 02/19/2020   Morbid (severe) obesity due to excess calories (HCC) 02/19/2020   Osteoarthrosis, unspecified whether generalized or localized, lower leg 02/19/2020   Hypertensive urgency 01/21/2020   Localized osteoarthritis of left knee 12/17/2018   Left elbow tendonitis 11/06/2018   Cervical spondylosis with radiculopathy 09/25/2018   Abnormal x-ray of cervical spine  06/27/2018   Radicular pain in left arm 06/13/2018   Knee pain 08/07/2016   ANA positive 03/08/2016   Pain in unspecified joint 03/08/2016   Vitamin D deficiency 03/08/2016   Recurrent headache 02/24/2015   Weight gain 12/06/2011    ONSET DATE: 01/24/23  REFERRING DIAG: Bilateral CTR  THERAPY DIAG:  Scar condition and fibrosis of skin  Stiffness of left hand, not elsewhere classified  Stiffness of left wrist, not elsewhere classified  Muscle weakness (generalized)  Rationale for Evaluation and Treatment: Rehabilitation  SUBJECTIVE:   SUBJECTIVE STATEMENT: I did see Dr. Rosita Kea and is thinking scar tissue.  The we need to really work that 1 area really hurt.  Did not really want to give me a shot if I do not need it.  If need to continue with the Voltaren and scar pad and possibly going back to work 6th January Pt accompanied by: self  PERTINENT HISTORY: 03/08/23 Ortho note: The patient presents for clinical exam and determination of her work status post left carpal tunnel release on 01/24/2023. Her sutures were removed on 02/07/2023. She continues to experience pain in her palm and fingertips, although her numbness has improved. She reports difficulty using her left hand, particularly with gripping. Additionally, she has begun to experience radiating sensation in the middle of her hand. Activities such as squeezing a towel after showering and holding the steering wheel while driving exacerbate her symptoms.  Her job at [employer] 50 to [employer] and using  scissors and a box cutter. She notes that her job does not offer light duty.  Refer to OT   PRECAUTIONS: None     WEIGHT BEARING RESTRICTIONS: No  PAIN:  Are you having pain? 1-2/10 ulnar side of scar - guyons canal discomfort  FALLS: Has patient fallen in last 6 months? 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  HAND DOMINANCE: Right  UPPER EXTREMITY ROM:     Active ROM Right eval Left eval R/L  03/26/23 L 03/28/33 L 04/09/23 L 04/12/23  Shoulder flexion        Shoulder abduction        Shoulder adduction        Shoulder extension        Shoulder internal rotation        Shoulder external rotation        Elbow flexion        Elbow extension        Wrist flexion 90 90  90 85 ( loose fist 55 pull 1-2/10) 90 loose fist 78 1/10 pull  Wrist extension 55 60 pull 64/75 80 70 70  Wrist ulnar deviation        Wrist radial deviation        Wrist pronation        Wrist supination        (Blank rows = not tested)  Active ROM Right eval Left eval L 03/26/23 L 04/05/23 L 04/12/23  Thumb MCP (0-60)       Thumb IP (0-80)       Thumb Radial abd/add (0-55)   60 50 light pull in palm  60 60  Thumb Palmar abd/add (0-45)  42 pain 6/10   70 70 70  Thumb Opposition to Small Finger  Pain opposition to 4th   Opposition to 5th and sliding down  Opposition to 5th -resistance pull over hypothenar Opposition WNL - no pull  at thumb but resistance to 5th weakness at 5th slight pull   Index MCP (0-90)  70  80    Index PIP (0-100)   100 100    Index DIP (0-70)         Long MCP (0-90)   75  85    Long PIP (0-100)   100  100    Long DIP (0-70)         Ring MCP (0-90)   80  90    Ring PIP (0-100)    95 100    Ring DIP (0-70)         Little MCP (0-90)    90 90    Little PIP (0-100)    95 100    Little DIP (0-70)         (Blank rows = not tested)     HAND FUNCTION: Grip strength: Right: 60 lbs; Left: 25 lbs, Lateral pinch: Right: 16 lbs, Left: 8 lbs, and 3 point pinch: Right: 12 lbs, Left: 4 lbs  pain in L hand   03/26/23:  Grip strength: Right: 64 lbs;  Left: 32 lbs, Lateral pinch: Right: 18 lbs, Left: 15 lbs, and 3 point pinch:  Right: 12 lbs, Left: 10 lbs  03/29/23 Grip R 64 lbs and L 40 lbs  04/05/23 grip and prehension decrease because of discomfort over hypothenar  04/12/23: Grip strength: Right: 65 lbs; Left: 35 lbs, ( discomfort)  Lateral pinch: Right: 18 lbs, Left: 15 lbs, and 3 point pinch: Right: 14 lbs, Left: 11 lbs 04/12/23: Grip strength: Right: 65 lbs; Left: 45 lbs, ( discomfort)  Lateral pinch: Right: 18 lbs, Left: 16 lbs, and 3 point pinch: Right: 14 lbs, Left: 12 lbs  SENSATION: Report numbness improved  EDEMA: over L CT incision base of palm   COGNITION: Overall cognitive status: Within functional limits for tasks assessed       TODAY'S TREATMENT:                                                                                                                              DATE: 04/26/23 Patient with great progress from start of care and active range of motion as well as edema and pain as well as grip strength and prehension more in the right than the left. Patient limited by some discomfort on ulnar side of hand and scar with composite flexion of digits and wrist. Patient was on steroids. Patient referred to OT for another 3 to 4 weeks to focus on scar mobilization.- Patient reports still feeling somewhat of a discomfort with wrist flexion as well as composite fist.  1-2/10 at the most.  Try to use her hand more normally yesterday. Seen Dr. Rosita Kea this past Monday to focus on scar tissue. Focus today primarily scar mobilization after paraffin 8 minutes deep heat prior to scar mobilization Done mini massager with and without Cica -Care scar pad followed by Brushing using Graston tool #2 As well as sweeping with Graston tool #2 Use extractor at proximal part of scar.  With digit flexion and extension Use Coban as well as issued Coban and educated patient on using that for scar mobilization. Provided patient with new  Cica -Care scar pad for nighttime Followed scar massage by composite wrist flexion extension with extended arm patient to do same at home. Local ice massage at the end.  PATIENT EDUCATION: Education details: findings of eval and HEP  Person educated: Patient Education method: Explanation, Demonstration, Tactile cues, Verbal cues, and Handouts Education comprehension: verbalized understanding, returned demonstration, verbal cues required, and needs further education   GOALS: Goals reviewed with patient? Yes  LONG TERM GOALS: Target date: 6 wks  Patient be independent in the home program to decrease pain in left hand to less than 2/10 with use. Baseline: Pain increases in the left palm and hand 6/10 with functional use Goal status: MET  2.  Bilateral wrist extension improved to within normal limits for patient to push and pull heavy door as well as turn doorknob without increased symptoms Baseline: Increase symptoms to 6/10 with wrist extension as well as radial abduction of thumb.  Decreased wrist extension 55  to 60 degrees. NOW 1-2/10 discomfort with pull Goal status:progressing  3.  Bilateral thumb active range of motion and strength increased within normal limits for patient to turn doorknob as well as squeeze washcloth cut with a knife Baseline: Increased pain with radial abduction.  Bilateral palmar abduction decreased to 42 degrees on the left with tenderness in webspace pain increased to 3-6/10 with use Goal status: MET  4.  Grip and prehension strength increased on the left within functional limits for her age to be able to return back to work Baseline: Right grip 60 and left 25 pounds, lateral pinch on the right 16 left 8 pounds and 3-point pinch in the right 12 pounds and left 4 pounds with increased symptoms NOW with grip - composite and tight grip discomfort ulnar side of scar  Goal status PROGRESSING  ASSESSMENT:  CLINICAL IMPRESSION: Patient seen for occupational  therapy for bilateral carpal tunnel surgery with the right in May 24 and left September 24.   Pt made great progress but then had issues with increased discomfort   over [place] canal  and ulnar side of scar in L hand-  Was on steroid but cont to feel discomfort  1-3/10-with a random activities.  But appear to be mostly with tight composite fist and composite fist in combination with wrist flexion . Seen surgeon and to focus on scar mobs - per surgeon had to go deeper in on the L - simulated work act and pt to cont with some conditioning for return to work - but the next 3-4 wks focus on scar mobs and composite flexion to be symptoms free.   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  Cont to be  limited in functional use of bilateral hands in ADLs and IADLs.  Patient can benefit from skilled OT services to decrease scar tissue and pain increase motion and strength to return to prior level of function as well as return back to work.  PERFORMANCE DEFICITS: in functional skills including ADLs, IADLs, ROM, strength, pain, flexibility, decreased knowledge of use of DME, and UE functional use,   and psychosocial skills including environmental adaptation and routines and behaviors.   IMPAIRMENTS: are limiting patient from ADLs, IADLs, rest and sleep, play, leisure, and social participation.   COMORBIDITIES: has no other co-morbidities that affects occupational performance. Patient will benefit from skilled OT to address above impairments and improve overall function.  MODIFICATION OR ASSISTANCE TO COMPLETE EVALUATION: No modification of tasks or assist necessary to complete an evaluation.  OT OCCUPATIONAL PROFILE AND HISTORY: Problem focused assessment: Including review of records relating to presenting problem.  CLINICAL DECISION MAKING: LOW - limited treatment options, no task modification necessary  REHAB POTENTIAL: Good for goals  EVALUATION COMPLEXITY:  Low   PLAN:  OT FREQUENCY: 2x week and decrease to  1x wk as progress   OT DURATION: 4 weeks PLANNING: Splinting;Paraffin;Fluidtherapy;Contrast Bath;DME and/or AE instruction;Manual Therapy;Passive range of motion;Scar mobilization;Therapeutic activities, Ultrasound, Iontophoresis, There ex, pt education     Oletta Cohn, OTR/L,CLT 05/02/2023, 5:32 PM

## 2023-05-08 ENCOUNTER — Ambulatory Visit: Payer: 59 | Admitting: Occupational Therapy

## 2023-05-08 DIAGNOSIS — M25632 Stiffness of left wrist, not elsewhere classified: Secondary | ICD-10-CM

## 2023-05-08 DIAGNOSIS — M6281 Muscle weakness (generalized): Secondary | ICD-10-CM

## 2023-05-08 DIAGNOSIS — L905 Scar conditions and fibrosis of skin: Secondary | ICD-10-CM

## 2023-05-08 DIAGNOSIS — M25642 Stiffness of left hand, not elsewhere classified: Secondary | ICD-10-CM

## 2023-05-10 ENCOUNTER — Ambulatory Visit: Payer: 59 | Admitting: Occupational Therapy

## 2023-05-10 DIAGNOSIS — L905 Scar conditions and fibrosis of skin: Secondary | ICD-10-CM

## 2023-05-10 DIAGNOSIS — M25632 Stiffness of left wrist, not elsewhere classified: Secondary | ICD-10-CM

## 2023-05-10 DIAGNOSIS — M6281 Muscle weakness (generalized): Secondary | ICD-10-CM

## 2023-05-10 DIAGNOSIS — M25642 Stiffness of left hand, not elsewhere classified: Secondary | ICD-10-CM

## 2023-05-11 ENCOUNTER — Encounter: Payer: Self-pay | Admitting: Occupational Therapy

## 2023-05-11 NOTE — Therapy (Unsigned)
OUTPATIENT OCCUPATIONAL THERAPY ORTHO Treatment  Patient Name: Kristen Shepherd MRN: [id number] DOB:06/02/1975, 47 y.o., female Today's Date: 05/11/2023  PCP: Shelton Silvas PA REFERRING PROVIDER: Dr Rosita Kea  END OF SESSION:  OT End of Session - 05/11/23 2030     Visit Number 11    Number of Visits 18    Date for OT Re-Evaluation 05/31/23    OT Start Time 1115    OT Stop Time 1203    OT Time Calculation (min) 48 min    Activity Tolerance Patient tolerated treatment well    Behavior During Therapy [facility] for tasks assessed/performed             Past Medical History:  Diagnosis Date   Arthritis    knees, elbows, shoulders, ankles, wrists   DUB (dysfunctional uterine bleeding) 6.22.12   Endometrial polyp 6.22.12   Headache    Hypertension    IUD 11/24/10   MIRENA INSERTED 11/24/10   Vitamin D deficiency    Past Surgical History:  Procedure Laterality Date   COLONOSCOPY WITH PROPOFOL N/A 03/27/2022   Procedure: COLONOSCOPY WITH PROPOFOL;  Surgeon: Regis Bill, MD;  Location: [facility] ENDOSCOPY;  Service: Endoscopy;  Laterality: N/A;   HYSTEROSCOPY  6.22.12   polyp and dub   INTRAUTERINE DEVICE INSERTION  02/05/2019   MIRENA   ROBOTIC ASSISTED LAPAROSCOPIC HYSTERECTOMY AND SALPINGECTOMY Bilateral 04/21/2020   Procedure: XI ROBOTIC ASSISTED LAPAROSCOPIC TOTAL HYSTERECTOMY AND SALPINGECTOMY;  Surgeon: Theresia Majors, MD;  Location: [facility] OR;  Service: Gynecology;  Laterality: Bilateral;   Patient Active Problem List   Diagnosis Date Noted   S/P hysterectomy 04/21/2020   Essential hypertension 02/19/2020   Morbid (severe) obesity due to excess calories (HCC) 02/19/2020   Osteoarthrosis, unspecified whether generalized or localized, lower leg 02/19/2020   Hypertensive urgency 01/21/2020   Localized osteoarthritis of left knee 12/17/2018   Left elbow tendonitis 11/06/2018   Cervical spondylosis with radiculopathy 09/25/2018   Abnormal x-ray of cervical spine 06/27/2018    Radicular pain in left arm 06/13/2018   Knee pain 08/07/2016   ANA positive 03/08/2016   Pain in unspecified joint 03/08/2016   Vitamin D deficiency 03/08/2016   Recurrent headache 02/24/2015   Weight gain 12/06/2011    ONSET DATE: 01/24/23  REFERRING DIAG: Bilateral CTR  THERAPY DIAG:  Scar condition and fibrosis of skin  Stiffness of left hand, not elsewhere classified  Stiffness of left wrist, not elsewhere classified  Muscle weakness (generalized)  Rationale for Evaluation and Treatment: Rehabilitation  SUBJECTIVE:   SUBJECTIVE STATEMENT:   Pt accompanied by: self  PERTINENT HISTORY: 03/08/23 Ortho note: The patient presents for clinical exam and determination of her work status post left carpal tunnel release on 01/24/2023. Her sutures were removed on 02/07/2023. She continues to experience pain in her palm and fingertips, although her numbness has improved. She reports difficulty using her left hand, particularly with gripping. Additionally, she has begun to experience radiating sensation in the middle of her hand. Activities such as squeezing a towel after showering and holding the steering wheel while driving exacerbate her symptoms.  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 She notes that her job does not offer light duty.  Refer to OT   PRECAUTIONS: None     WEIGHT BEARING RESTRICTIONS: No  PAIN:  Are you having pain? 1-2/10 ulnar side of scar - guyons canal discomfort  FALLS: Has patient fallen in last 6 months? 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  HAND DOMINANCE:  Right  UPPER EXTREMITY ROM:     Active ROM Right eval Left eval R/L  03/26/23 L 03/28/33 L 04/09/23 L 04/12/23  Shoulder flexion        Shoulder abduction        Shoulder adduction        Shoulder extension        Shoulder internal rotation        Shoulder external rotation        Elbow flexion        Elbow extension        Wrist flexion 90 90  90 85 ( loose fist 55 pull 1-2/10) 90 loose fist 78 1/10 pull  Wrist extension 55 60 pull 64/75 80 70 70  Wrist ulnar deviation        Wrist radial deviation        Wrist pronation        Wrist supination        (Blank rows = not tested)  Active ROM Right eval Left eval L 03/26/23 L 04/05/23 L 04/12/23  Thumb MCP (0-60)       Thumb IP (0-80)       Thumb Radial abd/add (0-55)   60 50 light pull in palm  60 60  Thumb Palmar abd/add (0-45)  42 pain 6/10   70 70 70  Thumb Opposition to Small Finger  Pain opposition to 4th   Opposition to 5th and sliding down  Opposition to 5th -resistance pull over hypothenar Opposition WNL - no pull  at thumb but resistance to 5th weakness at 5th slight pull   Index MCP (0-90)  70  80    Index PIP (0-100)   100 100    Index DIP (0-70)         Long MCP (0-90)   75  85    Long PIP (0-100)   100  100    Long DIP (0-70)         Ring MCP (0-90)   80  90    Ring PIP (0-100)    95 100    Ring DIP (0-70)         Little MCP (0-90)    90 90    Little PIP (0-100)    95 100    Little DIP (0-70)         (Blank rows = not tested)     HAND FUNCTION: Grip strength: Right: 60 lbs; Left: 25 lbs, Lateral pinch: Right: 16 lbs, Left: 8 lbs, and 3 point pinch: Right: 12 lbs, Left: 4 lbs  pain in L hand   03/26/23:  Grip strength: Right: 64 lbs; Left: 32 lbs, Lateral pinch: Right: 18 lbs, Left: 15 lbs, and 3 point pinch: Right: 12 lbs, Left: 10 lbs  03/29/23 Grip R 64 lbs and L 40 lbs  04/05/23 grip and prehension decrease because of discomfort over hypothenar  04/12/23: Grip strength: Right: 65 lbs; Left: 35  lbs, ( discomfort)  Lateral pinch: Right: 18 lbs, Left: 15 lbs, and 3 point pinch: Right: 14 lbs, Left: 11 lbs  04/12/23: Grip strength: Right: 65 lbs; Left: 45 lbs, ( discomfort)  Lateral pinch: Right: 18 lbs, Left: 16 lbs, and 3 point pinch: Right: 14 lbs, Left: 12 lbs  SENSATION: Report numbness improved  EDEMA: over L CT incision base of palm   COGNITION: Overall cognitive status: Within functional limits for tasks assessed       TODAY'S TREATMENT:                                                                                                                              DATE: 05/08/23 Patient with great progress from start of care and active range of motion as well as edema and pain as well as grip strength and prehension more in the right than the left. Patient limited by some discomfort on ulnar side of hand and scar with composite flexion of digits and wrist. Patient was on steroids. Patient referred to OT for another 3 to 4 weeks to focus on scar mobilization.- Patient reports still feeling somewhat of a discomfort with wrist flexion as well as composite fist.  1-2/10 at the most.  Try to use her hand more normally yesterday. Seen Dr. Rosita Kea this past Monday to focus on scar tissue. Focus today primarily scar mobilization after paraffin 8 minutes deep heat prior to scar mobilization Done mini massager with and without Cica -Care scar pad followed by Brushing using Graston tool #2 As well as sweeping with Graston tool #2 Use extractor at proximal part of scar.  With digit flexion and extension Use Coban as well as issued Coban and educated patient on using that for scar mobilization. Provided patient with new Cica -Care scar pad for nighttime Followed scar massage by composite wrist flexion extension with extended arm patient to do same at home. Local ice massage at the end.  PATIENT EDUCATION: Education details: findings of eval and HEP  Person educated: Patient Education  method: Explanation, Demonstration, Tactile cues, Verbal cues, and Handouts Education comprehension: verbalized understanding, returned demonstration, verbal cues required, and needs further education   GOALS: Goals reviewed with patient? Yes  LONG TERM GOALS: Target date: 6 wks  Patient be independent in the home program to decrease pain in left hand to less than 2/10 with use. Baseline: Pain increases in the left palm and hand 6/10 with functional use Goal status: MET  2.  Bilateral wrist extension improved to within normal limits for patient to push and pull heavy door as well as turn doorknob without increased symptoms Baseline: Increase symptoms to 6/10 with wrist extension as well as radial abduction of thumb.  Decreased wrist extension 55 to 60 degrees. NOW 1-2/10 discomfort with pull Goal status:progressing  3.  Bilateral thumb active range of motion and strength increased within normal limits for patient to turn doorknob as well as squeeze washcloth cut with a knife Baseline: Increased pain with radial abduction.  Bilateral palmar abduction decreased to 42 degrees on the left with tenderness  in webspace pain increased to 3-6/10 with use Goal status: MET  4.  Grip and prehension strength increased on the left within functional limits for her age to be able to return back to work Baseline: Right grip 60 and left 25 pounds, lateral pinch on the right 16 left 8 pounds and 3-point pinch in the right 12 pounds and left 4 pounds with increased symptoms NOW with grip - composite and tight grip discomfort ulnar side of scar  Goal status PROGRESSING  ASSESSMENT:  CLINICAL IMPRESSION: Patient seen for occupational therapy for bilateral carpal tunnel surgery with the right in May 24 and left September 24.   Pt made great progress but then had issues with increased discomfort   over [place] canal  and ulnar side of scar in L hand-  Was on steroid but cont to feel discomfort  1-3/10-with a  random activities.  But appear to be mostly with tight composite fist and composite fist in combination with wrist flexion . Seen surgeon and to focus on scar mobs - per surgeon had to go deeper in on the L - simulated work act and pt to cont with some conditioning for return to work - but the next 3-4 wks focus on scar mobs and composite flexion to be symptoms free.   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  Cont to be  limited in functional use of bilateral hands in ADLs and IADLs.  Patient can benefit from skilled OT services to decrease scar tissue and pain increase motion and strength to return to prior level of function as well as return back to work.  PERFORMANCE DEFICITS: in functional skills including ADLs, IADLs, ROM, strength, pain, flexibility, decreased knowledge of use of DME, and UE functional use,   and psychosocial skills including environmental adaptation and routines and behaviors.   IMPAIRMENTS: are limiting patient from ADLs, IADLs, rest and sleep, play, leisure, and social participation.   COMORBIDITIES: has no other co-morbidities that affects occupational performance. Patient will benefit from skilled OT to address above impairments and improve overall function.  MODIFICATION OR ASSISTANCE TO COMPLETE EVALUATION: No modification of tasks or assist necessary to complete an evaluation.  OT OCCUPATIONAL PROFILE AND HISTORY: Problem focused assessment: Including review of records relating to presenting problem.  CLINICAL DECISION MAKING: LOW - limited treatment options, no task modification necessary  REHAB POTENTIAL: Good for goals  EVALUATION COMPLEXITY: Low   PLAN:  OT FREQUENCY: 2x week and decrease to  1x wk as progress   OT DURATION: 4 weeks PLANNING: Splinting;Paraffin;Fluidtherapy;Contrast Bath;DME and/or AE instruction;Manual Therapy;Passive range of motion;Scar mobilization;Therapeutic activities, Ultrasound,  Iontophoresis, There ex, pt education   Josias Tomerlin, OTR/L,CLT 05/11/2023, 8:31 PM

## 2023-05-13 ENCOUNTER — Encounter: Payer: Self-pay | Admitting: Occupational Therapy

## 2023-05-13 NOTE — Therapy (Signed)
OUTPATIENT OCCUPATIONAL THERAPY ORTHO TREATMENT  Patient Name: Kristen Shepherd MRN: [id number] DOB:06/20/1975, 47 y.o., female   PCP: Shelton Silvas PA REFERRING PROVIDER: Dr Rosita Kea  END OF SESSION:  OT End of Session - 05/13/23 1823     Visit Number 12    Number of Visits 18    Date for OT Re-Evaluation 05/31/23    OT Start Time 1615    OT Stop Time 1701    OT Time Calculation (min) 46 min    Activity Tolerance Patient tolerated treatment well    Behavior During Therapy [facility] for tasks assessed/performed             Past Medical History:  Diagnosis Date   Arthritis    knees, elbows, shoulders, ankles, wrists   DUB (dysfunctional uterine bleeding) 6.22.12   Endometrial polyp 6.22.12   Headache    Hypertension    IUD 11/24/10   MIRENA INSERTED 11/24/10   Vitamin D deficiency    Past Surgical History:  Procedure Laterality Date   COLONOSCOPY WITH PROPOFOL N/A 03/27/2022   Procedure: COLONOSCOPY WITH PROPOFOL;  Surgeon: Regis Bill, MD;  Location: [facility] ENDOSCOPY;  Service: Endoscopy;  Laterality: N/A;   HYSTEROSCOPY  6.22.12   polyp and dub   INTRAUTERINE DEVICE INSERTION  02/05/2019   MIRENA   ROBOTIC ASSISTED LAPAROSCOPIC HYSTERECTOMY AND SALPINGECTOMY Bilateral 04/21/2020   Procedure: XI ROBOTIC ASSISTED LAPAROSCOPIC TOTAL HYSTERECTOMY AND SALPINGECTOMY;  Surgeon: Theresia Majors, MD;  Location: [facility] OR;  Service: Gynecology;  Laterality: Bilateral;   Patient Active Problem List   Diagnosis Date Noted   S/P hysterectomy 04/21/2020   Essential hypertension 02/19/2020   Morbid (severe) obesity due to excess calories (HCC) 02/19/2020   Osteoarthrosis, unspecified whether generalized or localized, lower leg 02/19/2020   Hypertensive urgency 01/21/2020   Localized osteoarthritis of left knee 12/17/2018   Left elbow tendonitis 11/06/2018   Cervical spondylosis with radiculopathy 09/25/2018   Abnormal x-ray of cervical spine 06/27/2018   Radicular pain in left arm  06/13/2018   Knee pain 08/07/2016   ANA positive 03/08/2016   Pain in unspecified joint 03/08/2016   Vitamin D deficiency 03/08/2016   Recurrent headache 02/24/2015   Weight gain 12/06/2011    ONSET DATE: 01/24/23  REFERRING DIAG: Bilateral CTR  THERAPY DIAG:  Scar condition and fibrosis of skin  Stiffness of left hand, not elsewhere classified  Stiffness of left wrist, not elsewhere classified  Muscle weakness (generalized)  Rationale for Evaluation and Treatment: Rehabilitation  SUBJECTIVE STATEMENT: Pt reports she will be out of town at the beginning of the week [place] the beach for the holiday.    Pt accompanied by: self  PERTINENT HISTORY: 03/08/23 Ortho note: The patient presents for clinical exam and determination of her work status post left carpal tunnel release on 01/24/2023. Her sutures were removed on 02/07/2023. She continues to experience pain in her palm and fingertips, although her numbness has improved. She reports difficulty using her left hand, particularly with gripping. Additionally, she has begun to experience radiating sensation in the middle of her hand. Activities such as squeezing a towel after showering and holding the steering wheel while driving exacerbate her symptoms.  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 She notes that her job does not offer light duty.  Refer to OT   PRECAUTIONS: None  WEIGHT BEARING RESTRICTIONS: No  PAIN:  Are you having pain? 1-2/10 ulnar side of scar -  guyons canal discomfort  FALLS: Has patient fallen in last 6 months? 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  HAND DOMINANCE: Right  UPPER EXTREMITY ROM:     Active ROM Right eval Left eval R/L  03/26/23 L 03/28/33 L 04/09/23 L 04/12/23  Shoulder flexion        Shoulder abduction        Shoulder adduction        Shoulder extension        Shoulder internal rotation        Shoulder external rotation        Elbow flexion        Elbow extension        Wrist flexion 90 90  90 85 ( loose fist 55 pull 1-2/10) 90 loose fist 78 1/10 pull  Wrist extension 55 60 pull 64/75 80 70 70  Wrist ulnar deviation        Wrist radial deviation        Wrist pronation        Wrist supination        (Blank rows = not tested)  Active ROM Right eval Left eval L 03/26/23 L 04/05/23 L 04/12/23  Thumb MCP (0-60)       Thumb IP (0-80)       Thumb Radial abd/add (0-55)   60 50 light pull in palm  60 60  Thumb Palmar abd/add (0-45)  42 pain 6/10   70 70 70  Thumb Opposition to Small Finger  Pain opposition to 4th   Opposition to 5th and sliding down  Opposition to 5th -resistance pull over hypothenar Opposition WNL - no pull  at thumb but resistance to 5th weakness at 5th slight pull   Index MCP (0-90)  70  80    Index PIP (0-100)   100 100    Index DIP (0-70)         Long MCP (0-90)   75  85    Long PIP (0-100)   100  100    Long DIP (0-70)         Ring MCP (0-90)   80  90    Ring PIP (0-100)    95 100    Ring DIP (0-70)         Little MCP (0-90)    90 90    Little PIP (0-100)    95 100    Little DIP (0-70)         (Blank rows = not tested)   HAND FUNCTION: Grip strength: Right: 60 lbs; Left: 25 lbs, Lateral pinch: Right: 16 lbs, Left: 8 lbs, and 3 point pinch: Right: 12 lbs, Left: 4 lbs  pain in L hand   03/26/23:  Grip strength: Right: 64 lbs; Left: 32 lbs, Lateral pinch: Right: 18 lbs, Left: 15 lbs, and 3 point pinch: Right: 12 lbs, Left: 10 lbs  03/29/23 Grip R 64 lbs and L 40 lbs  04/05/23 grip and prehension decrease because of discomfort over hypothenar  04/12/23:  Grip strength: Right: 65 lbs; Left: 35 lbs, ( discomfort)  Lateral pinch: Right: 18 lbs, Left: 15  lbs, and 3 point pinch: Right: 14 lbs, Left: 11 lbs 04/12/23: Grip strength: Right: 65 lbs; Left: 45 lbs, ( discomfort)  Lateral pinch: Right: 18 lbs, Left: 16 lbs, and 3 point pinch: Right: 14 lbs, Left: 12 lbs  SENSATION: Report numbness improved  EDEMA: over L CT incision base of palm   COGNITION: Overall cognitive status: Within functional limits for tasks assessed  TODAY'S TREATMENT:                                                                                                                              DATE: 05/10/23  Going to a holiday party tonight and been trying to get ready for the holiday and will be out of town to [employer] for a few days next week.    Paraffin:   Pt seen for use of paraffin to hand and wrist to decrease pain, increase tissue mobility and increase range of motion.   Pain decreased over the last week.   Manual Therapy:  Primary focus is to continues with scar mobilization and management per MD in recent appt.  Following paraffin, pt seen for manual therapy techniques with use of mini massager, followed by brushing techniques with use of Graston tool #2, sweeping with Graston tool #2 Scar massage to decrease adhesions and improve ROM,  composite wrist flexion extension with extended arm patient to do same at home. Use extractor at proximal part of scar, with digit flexion and extension Cica -Care scar pad for nighttime  Therapeutic Exercises:  ROM exercises following manual therapy for wrist flexion/ext, supination, pronation, ulnar and radial deviation.   PATIENT EDUCATION: Education details: findings of eval and HEP  Person educated: Patient Education method: Explanation, Demonstration, Tactile cues, Verbal cues, and Handouts Education comprehension: verbalized understanding, returned demonstration, verbal cues required, and needs further  education   GOALS: Goals reviewed with patient? Yes  LONG TERM GOALS: Target date: 6 wks  Patient be independent in the home program to decrease pain in left hand to less than 2/10 with use. Baseline: Pain increases in the left palm and hand 6/10 with functional use Goal status: MET  2.  Bilateral wrist extension improved to within normal limits for patient to push and pull heavy door as well as turn doorknob without increased symptoms Baseline: Increase symptoms to 6/10 with wrist extension as well as radial abduction of thumb.  Decreased wrist extension 55 to 60 degrees. NOW 1-2/10 discomfort with pull Goal status:progressing  3.  Bilateral thumb active range of motion and strength increased within normal limits for patient to turn doorknob as well as squeeze washcloth cut with a knife Baseline: Increased pain with radial abduction.  Bilateral palmar abduction decreased to 42 degrees on the left with tenderness in webspace pain increased to 3-6/10 with use Goal status: MET  4.  Grip and prehension strength increased on the left within functional limits for her age to be able to return back  to work Baseline: Right grip 60 and left 25 pounds, lateral pinch on the right 16 left 8 pounds and 3-point pinch in the right 12 pounds and left 4 pounds with increased symptoms NOW with grip - composite and tight grip discomfort ulnar side of scar  Goal status PROGRESSING  ASSESSMENT:  CLINICAL IMPRESSION: Patient seen for occupational therapy for bilateral carpal tunnel surgery with the right in May 24 and left September 24.   Pt made great progress but then had issues with increased discomfort over Guyon canal and ulnar side of scar in L hand-  Was on steroid but cont to feel discomfort  1-3/10-with a random activities.  But appear to be mostly with tight composite fist and composite fist in combination with wrist flexion . 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  Continued focus on scar management to directly impact ROM, functional use and to decrease adhesions.  Increased toleration of scar massage and management techniques this week with less sensitivity and pain.  Scar improving and presentation is more mobile and less fibrotic.  Pt will continue with home program while she is away and will reassess towards the end of next week with her return.  Cont to be limited in functional use of bilateral hands in ADLs and IADLs.  Patient can benefit from skilled OT services to decrease scar tissue and pain increase motion and strength to return to prior level of function as well as return back to work.  PERFORMANCE DEFICITS: in functional skills including ADLs, IADLs, ROM, strength, pain, flexibility, decreased knowledge of use of DME, and UE functional use,   and psychosocial skills including environmental adaptation and routines and behaviors.   IMPAIRMENTS: are limiting patient from ADLs, IADLs, rest and sleep, play, leisure, and social participation.   COMORBIDITIES: has no other co-morbidities that affects occupational performance. Patient will benefit from skilled OT to address above impairments and improve overall function.  MODIFICATION OR ASSISTANCE TO COMPLETE EVALUATION: No modification of tasks or assist necessary to complete an evaluation.  OT OCCUPATIONAL PROFILE AND HISTORY: Problem focused assessment: Including review of records relating to presenting problem.  CLINICAL DECISION MAKING: LOW - limited treatment options, no task modification necessary  REHAB POTENTIAL: Good for goals  EVALUATION COMPLEXITY: Low PLAN:  OT FREQUENCY: 2x week and decrease to  1x wk as progress   OT  DURATION: 4 weeks PLANNING: Splinting;Paraffin;Fluidtherapy;Contrast Bath;DME and/or AE instruction;Manual Therapy;Passive range of motion;Scar mobilization;Therapeutic activities, Ultrasound, Iontophoresis, There ex, pt education   November Sypher, OTR/L,CLT 05/13/2023, 6:24 PM

## 2023-05-14 ENCOUNTER — Ambulatory Visit: Payer: 59 | Admitting: Occupational Therapy

## 2023-05-21 ENCOUNTER — Ambulatory Visit: Payer: 59 | Admitting: Occupational Therapy

## 2023-05-21 DIAGNOSIS — M6281 Muscle weakness (generalized): Secondary | ICD-10-CM

## 2023-05-21 DIAGNOSIS — M25632 Stiffness of left wrist, not elsewhere classified: Secondary | ICD-10-CM

## 2023-05-21 DIAGNOSIS — L905 Scar conditions and fibrosis of skin: Secondary | ICD-10-CM

## 2023-05-21 DIAGNOSIS — M25642 Stiffness of left hand, not elsewhere classified: Secondary | ICD-10-CM

## 2023-05-24 ENCOUNTER — Ambulatory Visit: Payer: 59 | Admitting: Occupational Therapy

## 2024-02-13 ENCOUNTER — Other Ambulatory Visit: Payer: Self-pay | Admitting: Obstetrics and Gynecology

## 2024-02-13 DIAGNOSIS — N6489 Other specified disorders of breast: Secondary | ICD-10-CM

## 2024-02-15 ENCOUNTER — Ambulatory Visit
Admission: RE | Admit: 2024-02-15 | Discharge: 2024-02-15 | Disposition: A | Discharge status: Home / Self Care | Source: Ambulatory Visit | Attending: Obstetrics and Gynecology | Admitting: Obstetrics and Gynecology

## 2024-02-15 DIAGNOSIS — N6489 Other specified disorders of breast: Secondary | ICD-10-CM | POA: Insufficient documentation
